# Patient Record
Sex: Female | Born: 1970 | Race: Black or African American | Hispanic: No | Marital: Single | Smoking: Former smoker
Health system: Southern US, Community
[De-identification: ages and names within clinical notes are randomized; demographics above are authoritative.]

## PROBLEM LIST (undated history)

## (undated) DIAGNOSIS — J449 Chronic obstructive pulmonary disease, unspecified: Secondary | ICD-10-CM

## (undated) DIAGNOSIS — G4733 Obstructive sleep apnea (adult) (pediatric): Secondary | ICD-10-CM

## (undated) DIAGNOSIS — L732 Hidradenitis suppurativa: Secondary | ICD-10-CM

## (undated) DIAGNOSIS — M1990 Unspecified osteoarthritis, unspecified site: Secondary | ICD-10-CM

## (undated) DIAGNOSIS — I10 Essential (primary) hypertension: Secondary | ICD-10-CM

## (undated) HISTORY — DX: Essential (primary) hypertension: I10

## (undated) HISTORY — DX: Morbid (severe) obesity due to excess calories: E66.01

## (undated) HISTORY — DX: Obstructive sleep apnea (adult) (pediatric): G47.33

## (undated) HISTORY — DX: Hidradenitis suppurativa: L73.2

## (undated) HISTORY — DX: Chronic obstructive pulmonary disease, unspecified: J44.9

## (undated) HISTORY — DX: Unspecified osteoarthritis, unspecified site: M19.90

---

## 2005-04-12 ENCOUNTER — Emergency Department (HOSPITAL_COMMUNITY): Admission: EM | Admit: 2005-04-12 | Discharge: 2005-04-13 | Payer: Self-pay | Admitting: Emergency Medicine

## 2005-04-12 ENCOUNTER — Emergency Department (HOSPITAL_COMMUNITY): Admission: EM | Admit: 2005-04-12 | Discharge: 2005-04-12 | Payer: Self-pay | Admitting: Emergency Medicine

## 2007-10-04 ENCOUNTER — Emergency Department (HOSPITAL_COMMUNITY): Admission: EM | Admit: 2007-10-04 | Discharge: 2007-10-04 | Payer: Self-pay | Admitting: Emergency Medicine

## 2008-09-10 ENCOUNTER — Emergency Department (HOSPITAL_COMMUNITY): Admission: EM | Admit: 2008-09-10 | Discharge: 2008-09-10 | Payer: Self-pay | Admitting: Emergency Medicine

## 2010-03-03 ENCOUNTER — Emergency Department (HOSPITAL_COMMUNITY): Admission: EM | Admit: 2010-03-03 | Discharge: 2010-03-03 | Payer: Self-pay | Admitting: Emergency Medicine

## 2010-03-10 ENCOUNTER — Emergency Department (HOSPITAL_COMMUNITY): Admission: EM | Admit: 2010-03-10 | Discharge: 2010-03-10 | Payer: Self-pay | Admitting: Emergency Medicine

## 2010-03-26 ENCOUNTER — Emergency Department (HOSPITAL_COMMUNITY): Admission: EM | Admit: 2010-03-26 | Discharge: 2010-03-26 | Payer: Self-pay | Admitting: Emergency Medicine

## 2010-04-01 ENCOUNTER — Emergency Department (HOSPITAL_COMMUNITY): Admission: EM | Admit: 2010-04-01 | Discharge: 2010-04-02 | Payer: Self-pay | Admitting: Emergency Medicine

## 2010-04-06 ENCOUNTER — Emergency Department (HOSPITAL_COMMUNITY): Admission: EM | Admit: 2010-04-06 | Discharge: 2010-04-07 | Payer: Self-pay | Admitting: Emergency Medicine

## 2010-04-15 ENCOUNTER — Emergency Department (HOSPITAL_COMMUNITY): Admission: EM | Admit: 2010-04-15 | Discharge: 2010-04-16 | Payer: Self-pay | Admitting: Emergency Medicine

## 2010-04-19 ENCOUNTER — Encounter: Payer: Self-pay | Admitting: Pulmonary Disease

## 2010-04-27 ENCOUNTER — Encounter (INDEPENDENT_AMBULATORY_CARE_PROVIDER_SITE_OTHER): Payer: Self-pay | Admitting: Cardiology

## 2010-04-27 ENCOUNTER — Ambulatory Visit (HOSPITAL_COMMUNITY): Admission: RE | Admit: 2010-04-27 | Discharge: 2010-04-27 | Payer: Self-pay | Admitting: Cardiology

## 2010-05-04 DIAGNOSIS — I10 Essential (primary) hypertension: Secondary | ICD-10-CM

## 2010-05-04 DIAGNOSIS — R05 Cough: Secondary | ICD-10-CM

## 2010-05-04 DIAGNOSIS — R0602 Shortness of breath: Secondary | ICD-10-CM

## 2010-05-04 DIAGNOSIS — R079 Chest pain, unspecified: Secondary | ICD-10-CM

## 2010-05-04 DIAGNOSIS — J45909 Unspecified asthma, uncomplicated: Secondary | ICD-10-CM | POA: Insufficient documentation

## 2010-05-05 ENCOUNTER — Ambulatory Visit: Payer: Self-pay | Admitting: Pulmonary Disease

## 2010-05-31 ENCOUNTER — Ambulatory Visit: Payer: Self-pay | Admitting: Pulmonary Disease

## 2010-06-06 ENCOUNTER — Telehealth (INDEPENDENT_AMBULATORY_CARE_PROVIDER_SITE_OTHER): Payer: Self-pay | Admitting: *Deleted

## 2010-06-30 ENCOUNTER — Telehealth: Payer: Self-pay | Admitting: Pulmonary Disease

## 2010-07-01 ENCOUNTER — Ambulatory Visit: Payer: Self-pay | Admitting: Pulmonary Disease

## 2010-07-01 DIAGNOSIS — G4730 Sleep apnea, unspecified: Secondary | ICD-10-CM | POA: Insufficient documentation

## 2010-08-03 ENCOUNTER — Ambulatory Visit: Payer: Self-pay | Admitting: Pulmonary Disease

## 2010-08-17 ENCOUNTER — Emergency Department (HOSPITAL_COMMUNITY): Admission: EM | Admit: 2010-08-17 | Discharge: 2010-08-17 | Payer: Self-pay | Admitting: Emergency Medicine

## 2010-08-29 ENCOUNTER — Emergency Department (HOSPITAL_COMMUNITY): Admission: EM | Admit: 2010-08-29 | Discharge: 2010-08-30 | Payer: Self-pay | Admitting: Emergency Medicine

## 2010-09-05 ENCOUNTER — Telehealth: Payer: Self-pay | Admitting: Pulmonary Disease

## 2010-10-12 ENCOUNTER — Ambulatory Visit: Payer: Self-pay | Admitting: Pulmonary Disease

## 2010-10-27 ENCOUNTER — Observation Stay (HOSPITAL_COMMUNITY): Admission: EM | Admit: 2010-10-27 | Discharge: 2010-10-29 | Payer: Self-pay | Admitting: Emergency Medicine

## 2010-10-28 ENCOUNTER — Encounter: Payer: Self-pay | Admitting: Internal Medicine

## 2010-11-02 ENCOUNTER — Ambulatory Visit: Payer: Self-pay | Admitting: Internal Medicine

## 2010-11-02 DIAGNOSIS — L732 Hidradenitis suppurativa: Secondary | ICD-10-CM

## 2010-11-11 ENCOUNTER — Ambulatory Visit: Payer: Self-pay | Admitting: Pulmonary Disease

## 2010-11-16 DIAGNOSIS — J4489 Other specified chronic obstructive pulmonary disease: Secondary | ICD-10-CM | POA: Insufficient documentation

## 2010-11-16 DIAGNOSIS — J449 Chronic obstructive pulmonary disease, unspecified: Secondary | ICD-10-CM

## 2010-12-13 IMAGING — CT CT ANGIO CHEST
2 of 5 series · 19 of 36 positions shown · IV contrast (APPLIED)
Comparison: C t a chest 04/02/2010.

CLINICAL DATA: Chest pain.  Numbness in the left side of the
chest.  Elevated D-dimer.

CT ANGIOGRAPHY CHEST WITH CONTRAST
TECHNIQUE: Multidetector CT imaging of the chest was performed
using the standard protocol during bolus administration of
intravenous contrast.  Multiplanar CT image reconstructions
including MIPs were obtained to evaluate the vascular anatomy.
Contrast:  100 ml Pmnipaque-EKK.

[Series 8: pulm embolism 1.5 b25f thins · axial · 0.72mm/px · z∈[-215,+6]mm · 16 of 307 slices shown]
[im 15/307  lung]
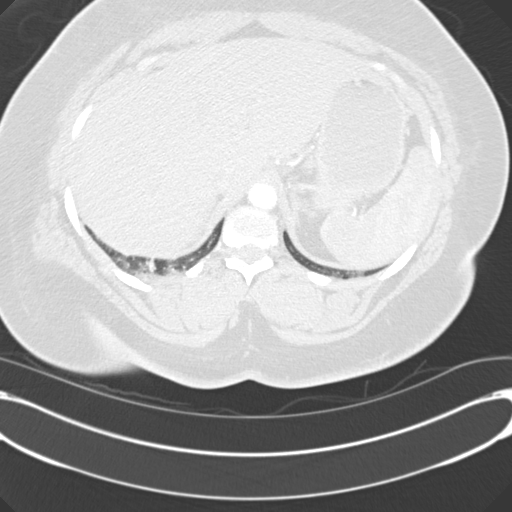
[im 30/307  mediastinal]
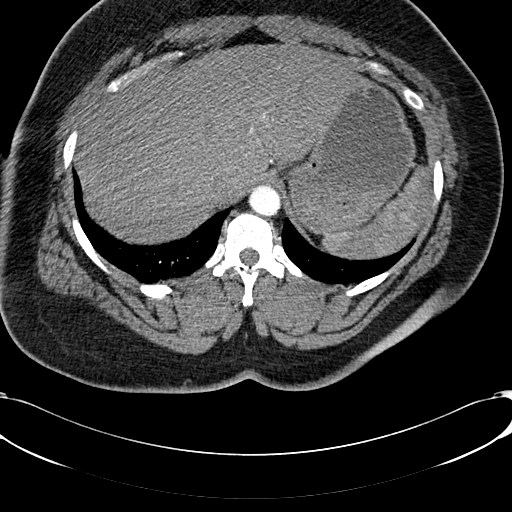
[im 59/307  lung]
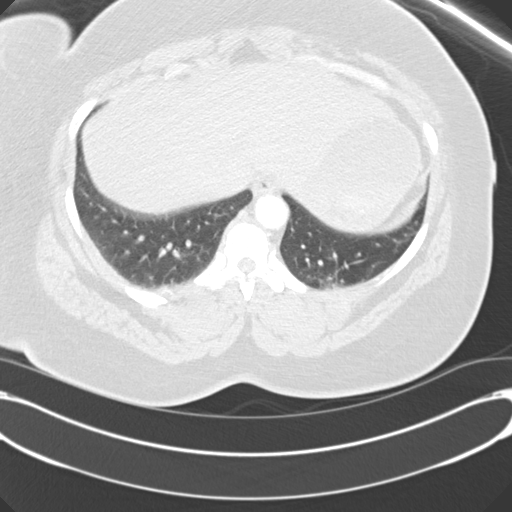
[im 73/307  mediastinal]
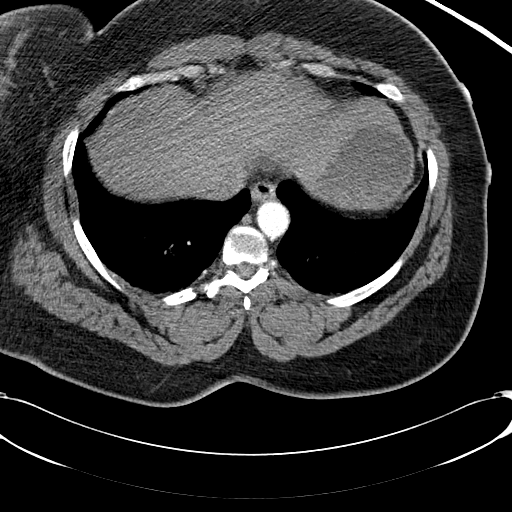
[im 88/307  lung]
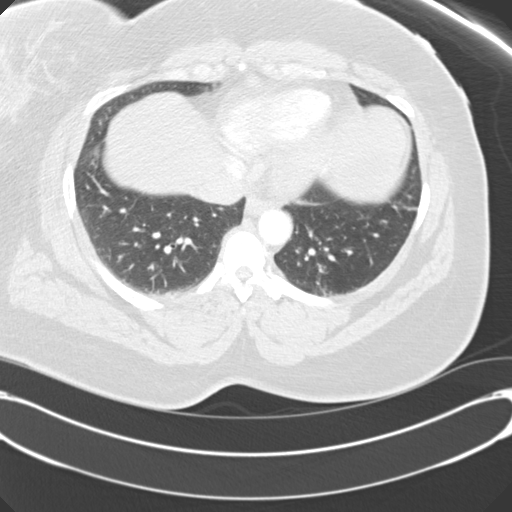
[im 103/307  mediastinal]
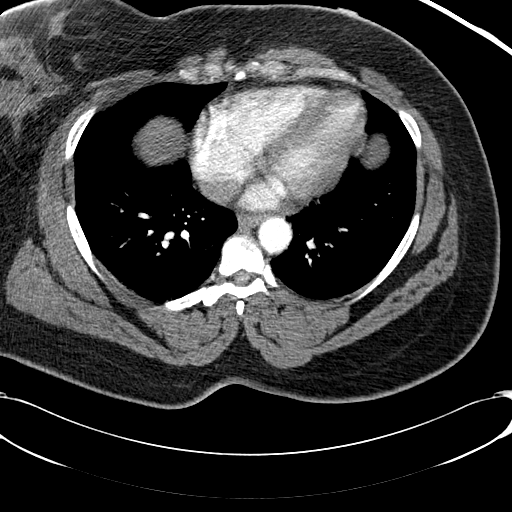
[im 132/307  lung]
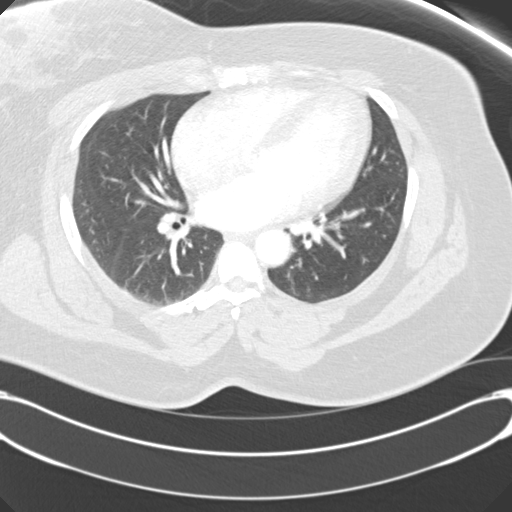
[im 146/307  mediastinal]
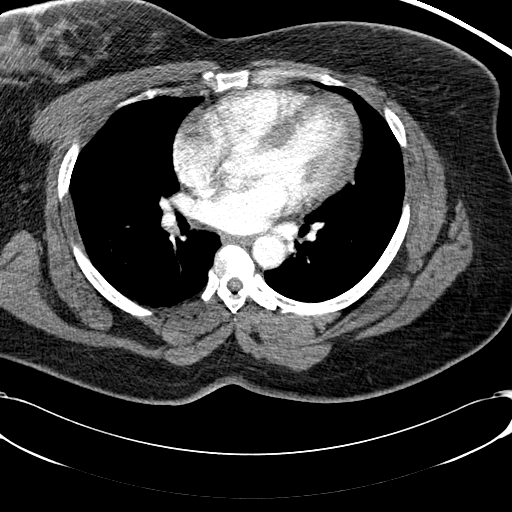
[im 161/307  lung]
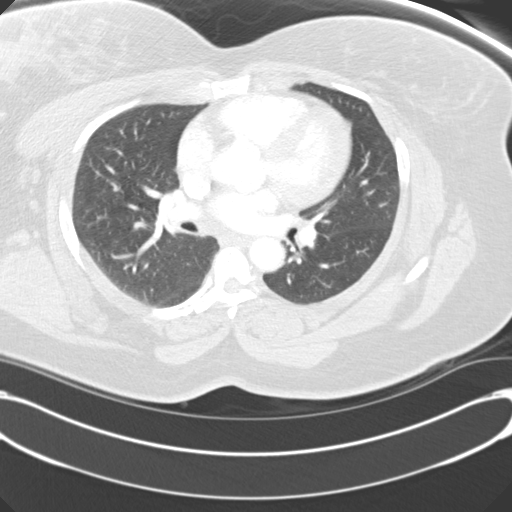
[im 175/307  mediastinal]
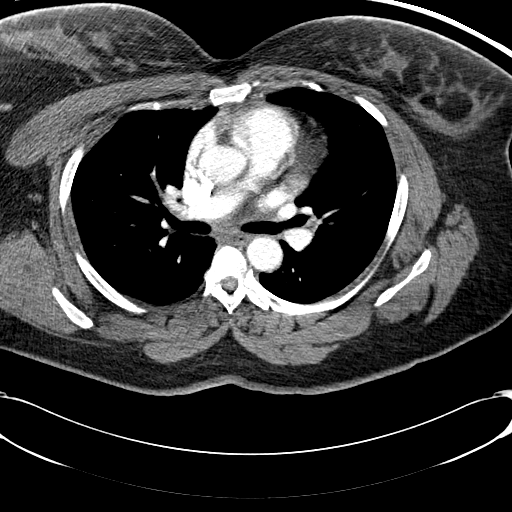
[im 205/307  lung]
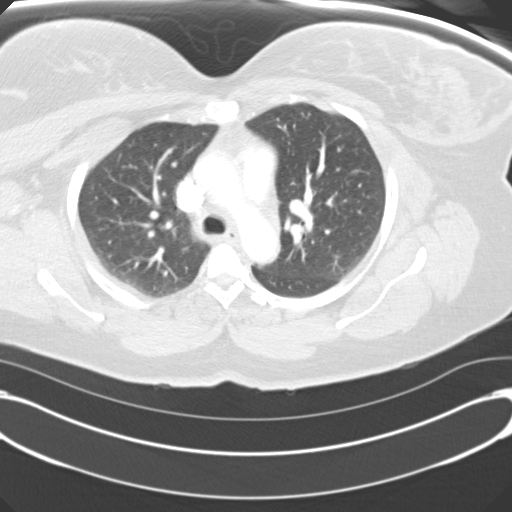
[im 219/307  mediastinal]
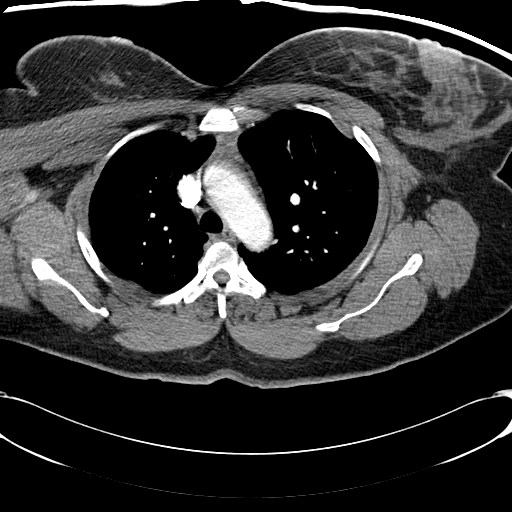
[im 234/307  lung]
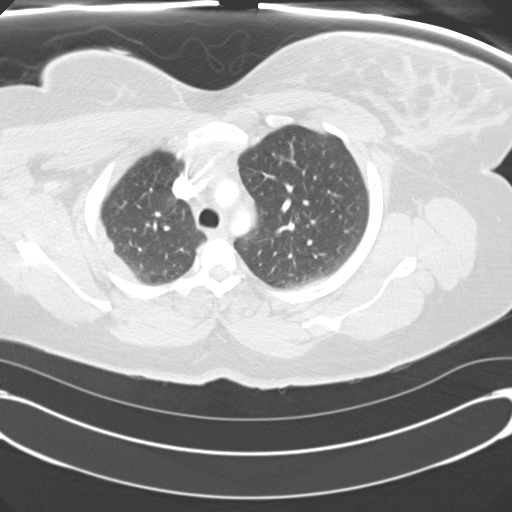
[im 248/307  mediastinal]
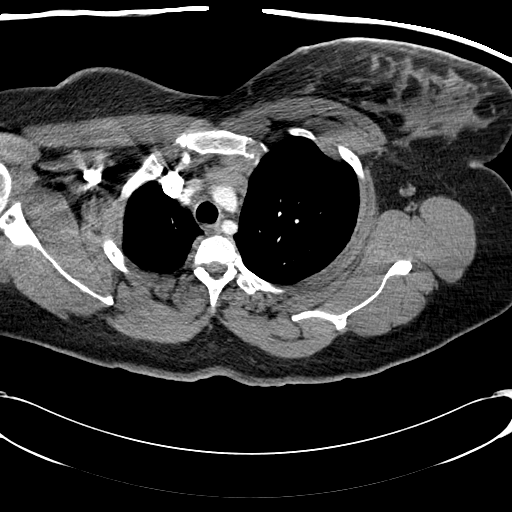
[im 277/307  lung]
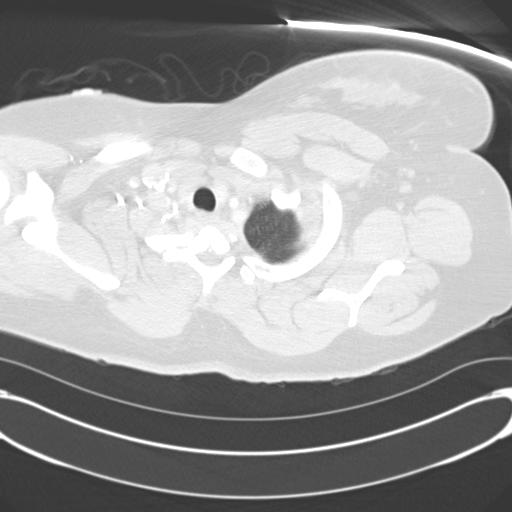
[im 292/307  mediastinal]
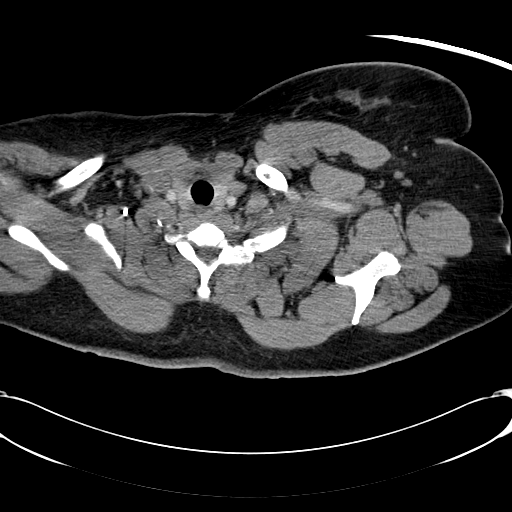

[Series 602: cor · coronal · 0.72mm/px · 3 of 93 slices shown]
[im 19/93  mediastinal]
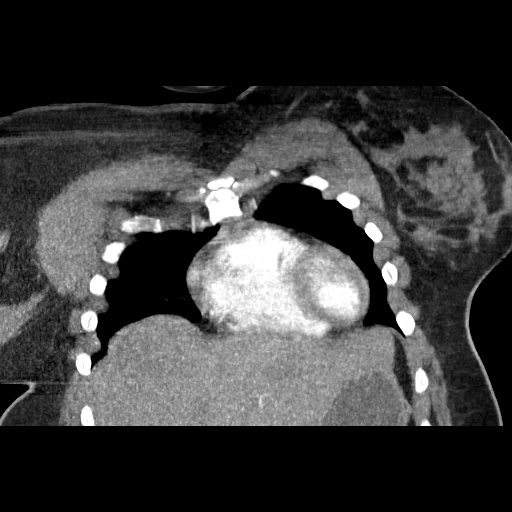
[im 37/93  mediastinal]
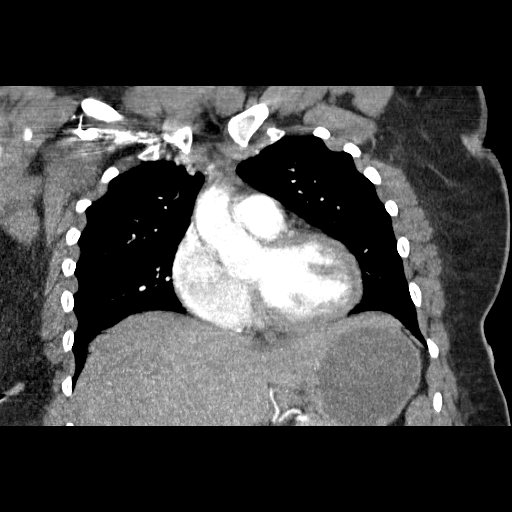
[im 56/93  mediastinal]
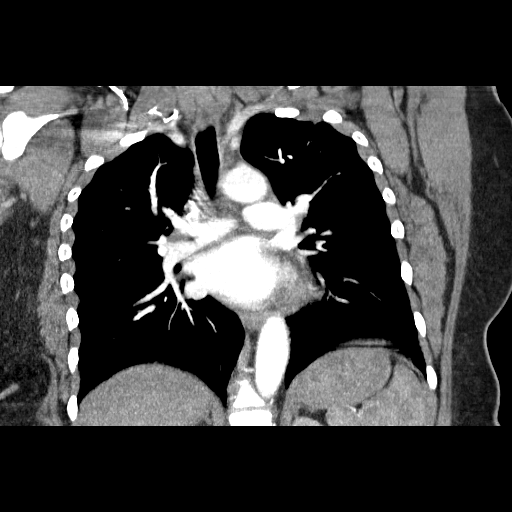

[19 of 36 positions shown; findings below may reference images not displayed]

FINDINGS: No pulmonary embolus is identified.  There is no pleural
or pericardial effusion.  Heart size is upper normal.  No axillary,
hilar or mediastinal lymphadenopathy.  The lungs demonstrate only
some mild dependent atelectatic change.  Visualized abdominal
contents are unremarkable.  There is no focal bony abnormality.

Review of the MIP images confirms the above findings.
IMPRESSION: Negative for pulmonary embolus.  Negative examination.

## 2010-12-14 ENCOUNTER — Ambulatory Visit: Admit: 2010-12-14 | Payer: Self-pay | Admitting: Pulmonary Disease

## 2010-12-15 ENCOUNTER — Telehealth: Payer: Self-pay | Admitting: Pulmonary Disease

## 2011-01-10 NOTE — Letter (Signed)
Summary: Viann Fish MD  Viann Fish MD   Imported By: Sherian Rein 05/11/2010 10:17:44  _____________________________________________________________________  External Attachment:    Type:   Image     Comment:   External Document

## 2011-01-10 NOTE — Discharge Summary (Signed)
Summary: Hospital Discharge Update    Hospital Discharge Update:  Date of Admission: 10/27/2010 Date of Discharge: 10/29/2010  Brief Summary:  Patient has chronic SOB and asthma flares in the past 2 years and reports a history of intubation in 1990's.  She was seen by her pulmonologist and was given a prednisone tapering dose on 11/2 and antibiotic; however, she did not fill her abx.  She was admitted for asthma exacerbation, worsening of SOB, productive cough with green sputum.   Patient was treated with IV solumedrol , Symbicort, Ventolin, Atrovent, Rocephin, and Azithromycin.  She improved significantly.   Labs needed at follow-up: CBC with differential, Basic metabolic panel  Other follow-up issues:  Check on her SOB and make sure she is satting ok.  The medication, problem, and allergy lists have been updated.  Please see the dictated discharge summary for details.  Discharge medications:  SYMBICORT 160-4.5 MCG/ACT AERO (BUDESONIDE-FORMOTEROL FUMARATE) Inhale 2 puffs two times a day PROVENTIL HFA 108 (90 BASE) MCG/ACT AERS (ALBUTEROL SULFATE) as needed ALBUTEROL SULFATE (2.5 MG/3ML) 0.083% NEBU (ALBUTEROL SULFATE) Every six hours and as needed  Other patient instructions:  You have an appointment with Dr. Cathey Endow on 11/02/2010 at 11:30 AM (Internal medicine Clinic) at [facility]  Please take your medications as prescribed above If your SOB gets progressively worse, please call our clinic or go to the ED   Note: Hospital Discharge Medications & Other Instructions handout was printed, one copy for patient and a second copy to be placed in hospital chart.

## 2011-01-10 NOTE — Assessment & Plan Note (Signed)
Summary: cough/Joan Brown   Visit Type:  Initial Consult Primary Provider/Referring Provider:  n/a  CC:  Pt c/o SOB x 2 years increasing over the last year.  History of Present Illness: 40/F , obese for evaluation of cough & 'asthma'. She has been evaluated by Dr Donnie Aho who feels she may have inflamed bronchial tubes. She does not have a PMD & has frequent ER visits. Repeated CXrs were nml as were CT angiograms on 04/02/10 & 04/15/10. She reports 'asthma' & bronchitis since childhood but worse in the last year. She developed dyspnea & cough in march & was initially treated with doxy, prednisone, she had ER visits onmarch 31, april 16, 23 , 28 & may 7th. She reports non productive cough without diurnal or seasonal variation. She has  used albuterol & pulmicort nebs from her cousin's daughter with some relief. She has used her cousin's spiriva samples with some relief. Dexilant was prescribed by dr Donnie Aho. he feels she does not have ischemic heart disease or pericarditis. She reports chest tightness, back spasms, pain int he back of her head & tingling in her extremities.   Preventive Screening-Counseling & Management  Alcohol-Tobacco     Smoking Status: quit     Packs/Day: 1.0     Year Started: 1988     Year Quit: 2011  Current Medications (verified): 1)  Proventil Hfa 108 (90 Base) Mcg/act Aers (Albuterol Sulfate) .... As Needed 2)  Albuterol Sulfate (2.5 Mg/24ml) 0.083% Nebu (Albuterol Sulfate) .... Every Six Hours and As Needed  Allergies (verified): No Known Drug Allergies  Past History:  Past Medical History: Last updated: 05/04/2010 ASTHMA (ICD-493.90) HYPERTENSION, BENIGN ESSENTIAL (ICD-401.1) OBESITY, MORBID (ICD-278.01) CHEST PAIN (ICD-786.50) COUGH (ICD-786.2) DYSPNEA (ICD-786.05)    Family History: Family History Asthma Allergies  Social History: Marital Status: single Children: no Occupation:Hair Stylist  Patient states former smoker. (1 ppd x 20 yrs, quit  04/02/2010) Smoking Status:  quit Packs/Day:  1.0  Review of Systems       The patient complains of shortness of breath with activity, shortness of breath at rest, productive cough, coughing up blood, chest pain, acid heartburn, weight change, abdominal pain, difficulty swallowing, tooth/dental problems, headaches, ear ache, anxiety, and hand/feet swelling.  The patient denies non-productive cough, irregular heartbeats, indigestion, loss of appetite, sore throat, nasal congestion/difficulty breathing through nose, sneezing, itching, depression, joint stiffness or pain, rash, change in color of mucus, and fever.    Vital Signs:  Patient profile:   40 year old female Height:      66 inches Weight:      272 pounds BMI:     44.06 O2 Sat:      98 % on Room air Temp:     98.2 degrees F oral Pulse rate:   74 / minute BP sitting:   140 / 92  (left arm) Cuff size:   large  Vitals Entered By: Zackery Barefoot CMA (May 05, 2010 9:18 AM)  O2 Flow:  Room air CC: Pt c/o SOB x 2 years increasing over the last year Comments Medications reviewed with patient Verified contact number and pharmacy with patient Zackery Barefoot CMA  May 05, 2010 9:19 AM    Physical Exam  Additional Exam:  Gen. Pleasant, well-nourished, in no distress, normal affect ENT - no lesions, no post nasal drip Neck: No JVD, no thyromegaly, no carotid bruits Lungs: no use of accessory muscles, no dullness to percussion, clear without rales or rhonchi  Cardiovascular: Rhythm regular, heart sounds  normal, no murmurs or gallops, no peripheral edema Abdomen: soft and non-tender, no hepatosplenomegaly, BS normal. Musculoskeletal: No deformities, no cyanosis or clubbing Neuro:  alert, non focal     Impression & Recommendations:  Problem # 1:  ASTHMA (ICD-493.90) Trial of symbicort 160/4.5 2 puffs two times a day  Use albuterol nebs as needed only Stop using pulmicort & spiriva Spirometry - pre/post on return visit to see  if she indeed has asthma Hope to decrease the frequency of her ER visits if we can get her a medical doctor.  Problem # 2:  COUGH (ICD-786.2)  Likely a combination of upper airway cough, gerd & less likley cough variant asthma. Would prefer to use generic meds here. Use pepcid two times a day (generic) instead of dexilant. Use zyrtec -D once daily x 30 days (generic)   Orders: Consultation Level IV (16109) Prescription Created Electronically [id number])  Medications Added to Medication List This Visit: 1)  Albuterol Sulfate (2.5 Mg/46ml) 0.083% Nebu (Albuterol sulfate) .... Every six hours and as needed  Other Orders: Pulmonary Referral (Pulmonary)  Patient Instructions: 1)  Copy sent to: [facility] , dr Donnie Aho 2)  Please schedule a follow-up appointment in 1 month with TP. 3)  Trial of symbicort 160/4.5 2 puffs two times a day  4)  Use albuterol nebs as needed only 5)  Stop using pulmicort & spiriva 6)  Use pepcid two times a day (generic) instead of dexilant 7)  Use zyrtec -D once daily x 30 days (generic)  8)  Breathing test when you return

## 2011-01-10 NOTE — Progress Notes (Signed)
Summary: nos appt  Phone Note Call from Patient   Caller: [email or web address]  Call For: alva Summary of Call: Rsc nos from 9/23 to 10/27 @ 3:30p. Initial call taken by: Darletta Moll,  September 05, 2010 2:03 PM

## 2011-01-10 NOTE — Progress Notes (Signed)
Summary: nos appt  Phone Note Call from Patient   Caller: [email or web address]  Call For: Elery Cadenhead Summary of Call: Rsc nos from 7/20 to 7/22 @ 4:15p. Initial call taken by: Darletta Moll,  June 30, 2010 9:52 AM

## 2011-01-10 NOTE — Progress Notes (Signed)
  Phone Note Other Incoming   Request: Send information Summary of Call: Request for records received from [facility]. Request forwarded to [facility].     

## 2011-01-10 NOTE — Assessment & Plan Note (Signed)
Summary: apena link only appt/cb   Allergies: No Known Drug Allergies  Past Pulmonary History:  Pulmonary History: ASTHMA (ICD-493.90) --PFT May 31, 2010 11>>>FEV1 2.20 l/m (76%), 13% change s/p BD   Other Orders: Sleep Std Airflow/Heartrate and O2 SAT unattended (16109)

## 2011-01-10 NOTE — Assessment & Plan Note (Signed)
Summary: NP follow up - PFT results   Primary Provider/Referring Provider:  n/a  CC:  follow up to review PFT results - states has wheezing and increased SOB at rest onset this morning, but this improved with albuterol HFA during PFT, and Back Pain.  History of Present Illness: 40/F , obese for evaluation of cough & 'asthma'.. Initial pulmonary consult 05/05/10. Continues to smoke.   05/05/10--She has been evaluated by Dr Donnie Aho who feels she may have inflamed bronchial tubes. She does not have a PMD & has frequent ER visits. Repeated CXrs were nml as were CT angiograms on 04/02/10 & 04/15/10. She reports 'asthma' & bronchitis since childhood but worse in the last year. She developed dyspnea & cough in march & was initially treated with doxy, prednisone, she had ER visits onmarch 31, april 16, 23 , 28 & may 7th. She reports non productive cough without diurnal or seasonal variation. She has  used albuterol & pulmicort nebs from her cousin's daughter with some relief. She has used her cousin's spiriva samples with some relief. Dexilant was prescribed by dr Donnie Aho. he feels she does not have ischemic heart disease or pericarditis. She reports chest tightness, back spasms, pain int he back of her head & tingling in her extremities.   May 31, 2010 --Returns for follow up and PFTs review. Last visit started on Symbicort 160/4.74mcg . Spiriva and pulmicort stopped. She depends on samples and does not have insurance -she is waiting on her medicaid right now. Her PFT showed today FEV1 2.20 l/m (76%)  , s/p bronchodilator w 13% change. I reviewed her PFT s results.  She cont to wear out easiy, no energy. Denies chest pain,  orthopnea, hemoptysis, fever, n/v/d, edema, headache.    Medications Prior to Update: 1)  Proventil Hfa 108 (90 Base) Mcg/act Aers (Albuterol Sulfate) .... As Needed 2)  Albuterol Sulfate (2.5 Mg/4ml) 0.083% Nebu (Albuterol Sulfate) .... Every Six Hours and As Needed  Current Medications  (verified): 1)  Proventil Hfa 108 (90 Base) Mcg/act Aers (Albuterol Sulfate) .... As Needed 2)  Albuterol Sulfate (2.5 Mg/15ml) 0.083% Nebu (Albuterol Sulfate) .... Every Six Hours and As Needed 3)  Symbicort 160-4.5 Mcg/act Aero (Budesonide-Formoterol Fumarate) .... Inhale 2 Puffs Two Times A Day  Allergies (verified): No Known Drug Allergies  Past History:  Family History: Last updated: 05/05/2010 Family History Asthma Allergies  Social History: Last updated: 05/31/2010 Marital Status: single Children: no Occupation:Hair Stylist  Patient states former smoker 04/05/10, smoked for 20 yr 1 PPD   Risk Factors: Smoking Status: quit (05/05/2010) Packs/Day: 1.0 (05/05/2010)  Past Medical History: ASTHMA (ICD-493.90) --PFT May 31, 2010 11>>>FEV1 2.20 l/m (76%), 13% change s/p BD  HYPERTENSION, BENIGN ESSENTIAL (ICD-401.1) OBESITY, MORBID (ICD-278.01) CHEST PAIN (ICD-786.50) COUGH (ICD-786.2) DYSPNEA (ICD-786.05)    Past Pulmonary History:  Pulmonary History: ASTHMA (ICD-493.90) --PFT May 31, 2010 11>>>FEV1 2.20 l/m (76%), 13% change s/p BD  Social History: Marital Status: single Children: no Occupation:Hair Stylist  Patient states former smoker 04/05/10, smoked for 20 yr 1 PPD   Review of Systems      See HPI  Vital Signs:  Patient profile:   40 year old female Height:      66 inches Weight:      270 pounds BMI:     43.74 O2 Sat:      94 % on Room air Temp:     97.6 degrees F oral Pulse rate:   71 / minute BP sitting:  138 / 80  (left arm) Cuff size:   regular  Vitals Entered By: Boone Master CNA/MA (May 31, 2010 11:11 AM)  O2 Flow:  Room air CC: follow up to review PFT results - states has wheezing and increased SOB at rest onset this morning, but this improved with albuterol HFA during PFT, Back Pain Is Patient Diabetic? No Comments Medications reviewed with patient Daytime contact number verified with patient. Boone Master CNA/MA  May 31, 2010  11:11 AM    Physical Exam  Additional Exam:  Gen. Pleasant, well-nourished, in no distress, normal affect, obese  ENT - no lesions, no post nasal drip Neck: No JVD, no thyromegaly, no carotid bruits Lungs: no use of accessory muscles, no dullness to percussion, clear without rales or rhonchi  Cardiovascular: Rhythm regular, heart sounds  normal, no murmurs or gallops, no peripheral edema Abdomen: soft and non-tender, no hepatosplenomegaly, BS normal. Musculoskeletal: No deformities, no cyanosis or clubbing Neuro:  alert, non focal     Impression & Recommendations:  Problem # 1:  ASTHMA (ICD-493.90) PFTs w/ 13% change s/p BD will cont on Symbicort  will need to decide on referral  to [facility] vs rx assistance program if she does not have medicaid on return to help w/ rx  sample for 4 weeks given.   Medications Added to Medication List This Visit: 1)  Symbicort 160-4.5 Mcg/act Aero (Budesonide-formoterol fumarate) .... Inhale 2 puffs two times a day  Complete Medication List: 1)  Symbicort 160-4.5 Mcg/act Aero (Budesonide-formoterol fumarate) .... Inhale 2 puffs two times a day 2)  Proventil Hfa 108 (90 Base) Mcg/act Aers (Albuterol sulfate) .... As needed 3)  Albuterol Sulfate (2.5 Mg/13ml) 0.083% Nebu (Albuterol sulfate) .... Every six hours and as needed  Other Orders: Est. Patient Level III (28413)  Patient Instructions: 1)  Continue on symbicort 160/4.108mcg 2 puffs two times a day , brush, rinse and gargle after use.  2)  follow up 4 weeks Dr. Vassie Loll  3)       Appended Document: NP follow up - PFT results minimal obstruction esp in smaller airways - doubt this is cacounting for her dyspnea

## 2011-01-10 NOTE — Miscellaneous (Signed)
Summary: Orders Update pft charges  Clinical Lists Changes  Orders: Added new Service order of Carbon Monoxide diffusing w/capacity (94720) - Signed Added new Service order of Lung Volumes (94240) - Signed Added new Service order of Spirometry (Pre & Post) (94060) - Signed 

## 2011-01-10 NOTE — Assessment & Plan Note (Addendum)
Summary: rov/jd   Visit Type:  Follow-up Primary Provider/Referring Provider:  n/a  CC:  6 month follow up Asthma/OSA. c/o increased SOB with exertion and at rest, wheezing, and productive cough with thick yellow mucus. Increased use of Albuterol neb and inhaler w/o relief. Pt request flu vaccine.  History of Present Illness: 40/F , ex -smoker, obese for evaluation of cough & 'asthma'. She depends on samples and does not have insurance -she is waiting on her medicaid right now.  She does not have a PMD & has frequent ER visits.  05/05/10--She has been evaluated by Dr Donnie Aho who feels she may have inflamed bronchial tubes.  Repeated CXrs were nml as were CT angiograms on 04/02/10 & 04/15/10. She reports 'asthma' & bronchitis since childhood but worse in the last year.  She reports non productive cough without diurnal or seasonal variation. She has  used albuterol & pulmicort nebs from her cousin's daughter with some relief. She has used her cousin's spiriva samples with some relief. Dexilant was prescribed by dr Donnie Aho. he feels she does not have ischemic heart disease or pericarditis.  -started on Symbicort 160/4.43mcg . Spiriva and pulmicort stopped.   July 01, 2010 4:30 PM  Cough & dyspnea better. has been unable to enroll with [facility]. Sharing meds with fiance. Her PFT showed  FEV1 2.20 l/m (76%)  , s/p bronchodilator w 13% change. I reviewed her PFT s results. Reports excessive daytime somnolence, loud snoring, choking episodes that wake her up. Home study showed  mild obstructive sleep apnea - stopped/ slowed breathing 8/h , weight loss advised   October 12, 2010 4:30 PM  ER visit for dyspnea -given pred quit smoking, could not get symbicort filled, got orange card -hope to start with [facility] & start getting prescriptions filled. Using nebs 4-5 times daily,  Bouts of coughing in the office  Denies chest pain,  orthopnea, hemoptysis, fever, n/v/d, edema, headache.   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  The patient denies anorexia, fever, weight loss, weight gain, vision loss, decreased hearing, hoarseness, chest pain, syncope, peripheral edema, prolonged cough, headaches, hemoptysis, abdominal pain, melena, hematochezia, severe indigestion/heartburn, hematuria, muscle weakness, suspicious skin lesions, transient blindness, difficulty walking, depression, unusual weight change, abnormal bleeding, enlarged lymph nodes, and angioedema.    Vital Signs:  Patient profile:   40 year old female Height:      66 inches Weight:      271.6 pounds BMI:     44.00 O2 Sat:      97 % on Room air Temp:     98.9 degrees F oral Pulse rate:   94 / minute BP sitting:   128 / 92  (  left arm) Cuff size:   large  Vitals Entered By: Zackery Barefoot CMA (October 12, 2010 4:19 PM)  O2 Flow:  Room air CC: 6 month follow up Asthma/OSA. c/o increased SOB with exertion and at rest, wheezing, productive cough with thick yellow mucus. Increased use of Albuterol neb and inhaler w/o relief. Pt request flu vaccine Comments Medications reviewed with patient Verified contact number  and pharmacy with patient Zackery Barefoot CMA  October 12, 2010 4:20 PM    Physical Exam  Additional Exam:  wt 271 October 12, 2010  Gen. Pleasant, well-nourished, in no distress, normal affect, obese  ENT - no lesions, no post nasal drip, class 3 airway Neck: No JVD, no thyromegaly, no carotid bruits Lungs: no use of accessory muscles, no dullness to percussion, decreased BL without rales or rhonchi  Cardiovascular: Rhythm regular, heart sounds  normal, no murmurs or gallops, no peripheral edema Musculoskeletal: No deformities, no cyanosis or clubbing      Impression & Recommendations:  Problem # 1:  ASTHMA (ICD-493.90) Symbicort 160/4.5 2 puffs two times a day - sample Get Rx fille dfrom [facility] DO NOT use nebuliser more than 4 times/ day Discussed plan for exacerbation - given steroid taper Rx - hopefully this will avoid ER visit We will try to see her more frequently to avoid ER visits  Problem # 2:  COUGH (ICD-786.2)  some element of VCD here Doxy x 10 ds for acute bronchitis  Orders: Est. Patient Level IV (16109) Prescription Created Electronically [id number])  Problem # 3:  OBSTRUCTIVE SLEEP APNEA (ICD-780.57)  mild on home study, explore further once asthma better controlled or if wt gain  Orders: Est. Patient Level IV (09811) Prescription Created Electronically [id number])  Medications Added to Medication List This Visit: 1)  Albuterol Sulfate (2.5 Mg/84ml) 0.083% Nebu (Albuterol sulfate) .... Every six hours and as needed 2)  Doxycycline Hyclate 100 Mg Caps (Doxycycline hyclate) .... Once daily 3)  Prednisone 10 Mg Tabs (Prednisone) .... Take 4 tabs  daily with food x 4 days, then 3 tabs daily x 4 days, then 2 tabs daily x 4 days, then 1 tab daily x4 days then stop. #40  Patient Instructions: 1)  Copy sent to: [facility] 2)  Please schedule a follow-up appointment in 1 month with TP 3)  Antibiotic x 10 ds 4)  Symbicort 160/4.5 2 puffs two times a day -  sample 5)  Get Rx fille dfrom [facility] 6)  DO NOT use nebuliser more than 4 times/ day Prescriptions: PREDNISONE 10 MG TABS (PREDNISONE) Take 4 tabs  daily with food x 4 days, then 3 tabs daily x 4 days, then 2 tabs daily x 4 days, then 1 tab daily x4 days then stop. #40  #40 x 0   Entered and Authorized by:   Comer Locket Vassie Loll MD   Signed by:   Comer Locket Vassie Loll MD on 10/12/2010   Method used:   Electronically to        [facility] (retail)       [address]       [place], [place]  [address]       Ph: [phone]       Fax: [phone]   RxID:   [id number] DOXYCYCLINE HYCLATE 100 MG CAPS (DOXYCYCLINE HYCLATE) once daily  #10 x 0   Entered and Authorized by:   Comer Locket. Vassie Loll MD   Signed  by:   Comer Locket Vassie Loll MD on 10/12/2010   Method used:   Electronically to        [facility] (retail)       [address]       [place], [place]  [address]       Ph: [phone]       Fax: [phone]   RxID:   [id number] ALBUTEROL SULFATE (2.5 MG/3ML) 0.083% NEBU (ALBUTEROL SULFATE) Every six hours and as needed  #60 x 2   Entered and Authorized by:   Comer Locket. Vassie Loll MD   Signed by:   Comer Locket Vassie Loll MD on 10/12/2010   Method used:   Electronically to        [facility] (retail)       [address]       [place], [place]  [address]       Ph: [phone]       Fax: [phone]   RxID:   [id number]

## 2011-01-10 NOTE — Assessment & Plan Note (Signed)
Summary: rov/jd   Visit Type:  Follow-up Primary Provider/Referring Provider:  n/a  CC:  Asthma.  The patient says her cough is slightly better but still productive with yellowish mucus. She says her breathing is no better or worse.Marland Kitchen  History of Present Illness: 39/F , smoker, obese for evaluation of cough & 'asthma'.. Initial pulmonary consult 05/05/10. Continues to smoke.   05/05/10--She has been evaluated by Dr Donnie Aho who feels she may have inflamed bronchial tubes. She does not have a PMD & has frequent ER visits. Repeated CXrs were nml as were CT angiograms on 04/02/10 & 04/15/10. She reports 'asthma' & bronchitis since childhood but worse in the last year. She developed dyspnea & cough in march & was initially treated with doxy, prednisone, she had ER visits on march 31, april 16, 23 , 28 & may 7th. She reports non productive cough without diurnal or seasonal variation. She has  used albuterol & pulmicort nebs from her cousin's daughter with some relief. She has used her cousin's spiriva samples with some relief. Dexilant was prescribed by dr Donnie Aho. he feels she does not have ischemic heart disease or pericarditis. She reports chest tightness, back spasms, pain int he back of her head & tingling in her extremities.   May 31, 2010 --Returns for follow up and PFTs review. Last visit started on Symbicort 160/4.32mcg . Spiriva and pulmicort stopped. She depends on samples and does not have insurance -she is waiting on her medicaid right now.   July 01, 2010 4:30 PM  Cough & dyspnea better. has been unable to enroll with [facility]. Sharing meds with fiance. Her PFT showed  FEV1 2.20 l/m (76%)  , s/p bronchodilator w 13% change. I reviewed her PFT s results. Reports excessive daytime somnolence, loud snoring, choking episodes that wake her up.  Denies chest pain,  orthopnea, hemoptysis, fever, n/v/d, edema, headache.   Current Medications (verified): 1)  Symbicort 160-4.5 Mcg/act Aero  (Budesonide-Formoterol Fumarate) .... Inhale 2 Puffs Two Times A Day 2)  Proventil Hfa 108 (90 Base) Mcg/act Aers (Albuterol Sulfate) .... As Needed 3)  Albuterol Sulfate (2.5 Mg/55ml) 0.083% Nebu (Albuterol Sulfate) .... Every Six Hours and As Needed  Allergies (verified): No Known Drug Allergies  Past History:  Past Medical History: Last updated: 05/31/2010 ASTHMA (ICD-493.90) --PFT May 31, 2010 11>>>FEV1 2.20 l/m (76%), 13% change s/p BD  HYPERTENSION, BENIGN ESSENTIAL (ICD-401.1) OBESITY, MORBID (ICD-278.01) CHEST PAIN (ICD-786.50) COUGH (ICD-786.2) DYSPNEA (ICD-786.05)    Social History: Last updated: 05/31/2010 Marital Status: single Children: no Occupation:Hair Stylist  Patient states former smoker 04/05/10, smoked for 20 yr 1 PPD   Past Pulmonary History:  Pulmonary History: ASTHMA (ICD-493.90) --PFT May 31, 2010 11>>>FEV1 2.20 l/m (76%), 13% change s/p BD  Review of Systems       The patient complains of dyspnea on exertion.  The patient denies anorexia, fever, weight loss, weight gain, vision loss, decreased hearing, hoarseness, chest pain, syncope, peripheral edema, prolonged cough, headaches, hemoptysis, abdominal pain, melena, hematochezia, severe indigestion/heartburn, hematuria, muscle weakness, suspicious skin lesions, difficulty walking, depression, unusual weight change, and abnormal bleeding.    Vital Signs:  Patient profile:   40 year old female Height:      66 inches (167.64 cm) Weight:      269.50 pounds (122.50 kg) BMI:     43.66 O2 Sat:      93 % on Room air Temp:     98.3 degrees F (36.83 degrees C) oral Pulse rate:  82 / minute BP sitting:   132 / 80  (right arm) Cuff size:   large  Vitals Entered By: Michel Bickers CMA (July 01, 2010 4:14 PM)  O2 Sat at Rest %:  93 O2 Flow:  Room air CC: Asthma.  The patient says her cough is slightly better but still productive with yellowish mucus. She says her breathing is no better or worse. Comments  Medications reviewed. Daytime phone verified. Michel Bickers [facility]  July 01, 2010 4:15 PM   Physical Exam  Additional Exam:  Gen. Pleasant, well-nourished, in no distress, normal affect, obese  ENT - no lesions, no post nasal drip, class 3 airway Neck: No JVD, no thyromegaly, no carotid bruits Lungs: no use of accessory muscles, no dullness to percussion, clear without rales or rhonchi  Cardiovascular: Rhythm regular, heart sounds  normal, no murmurs or gallops, no peripheral edema Musculoskeletal: No deformities, no cyanosis or clubbing      Impression & Recommendations:  Problem # 1:  ASTHMA (ICD-493.90) PFTs w/ 13% change s/p BD will cont on Symbicort   referral  to [facility] vs rx assistance program if she does not have medicaid on return to help w/ rx   Problem # 2:  OBSTRUCTIVE SLEEP APNEA (ICD-780.57) The pathophysiology of obstructive sleep apnea, it's cardiovascular consequences and modes of treatment including CPAP were discussed with the patient in great detail.  Home sleep study weight loss  Other Orders: Est. Patient Level III (16109) Sleep Disorder Referral (Sleep Disorder)  Patient Instructions: 1)  Copy sent to: 2)  Please schedule a follow-up appointment in 6 weeks. 3)  Stay on symbicort 2 puffs two times a day - RINSE mouth after use 4)  Try to get medications from [facility] 5)  Home sleep study - you mayneed  a CPAP machine 6)  Weight loss  Appended Document: rov/jd let her know - she has mild obstructive sleep apnea - stopped/ slowed breathing 8/h , weight loss advised at this time, call for excessive daytime somnolence or worsening  Appended Document: rov/jd pt informed. jwr  Appended Document: rov/jd pl arrange FU OV with TP - recent ER visit  Appended Document: rov/jd pt set to see RA on 09-02-10 at 11:30.

## 2011-01-10 NOTE — Assessment & Plan Note (Signed)
Summary: Acute NP office visit - bronchitis   Primary Provider/Referring Provider:  n/a  CC:  post hospital and coughing still. Marland Kitchen  History of Present Illness: 39/F , ex -smoker, obese for evaluation of cough & 'asthma'. She depends on samples and does not have insurance -she is waiting on her medicaid right now.  She does not have a PMD & has frequent ER visits.  05/05/10--She has been evaluated by Dr Donnie Aho who feels she may have inflamed bronchial tubes.  Repeated CXrs were nml as were CT angiograms on 04/02/10 & 04/15/10. She reports 'asthma' & bronchitis since childhood but worse in the last year.  She reports non productive cough without diurnal or seasonal variation. She has  used albuterol & pulmicort nebs from her cousin's daughter with some relief. She has used her cousin's spiriva samples with some relief. Dexilant was prescribed by dr Donnie Aho. he feels she does not have ischemic heart disease or pericarditis.  -started on Symbicort 160/4.20mcg . Spiriva and pulmicort stopped.   July 01, 2010 4:30 PM  Cough & dyspnea better. has been unable to enroll with [facility]. Sharing meds with fiance. Her PFT showed  FEV1 2.20 l/m (76%)  , s/p bronchodilator w 13% change. I reviewed her PFT s results. Reports excessive daytime somnolence, loud snoring, choking episodes that wake her up. Home study showed  mild obstructive sleep apnea - stopped/ slowed breathing 8/h , weight loss advised   October 12, 2010-- ER visit for dyspnea -given pred quit smoking, could not get symbicort filled, got orange card -hope to start with [facility] & start getting prescriptions filled. Using nebs 4-5 times daily,  Bouts of coughing in the office  Denies chest pain,  orthopnea, hemoptysis, fever, n/v/d, edema, headache.  11/11/10--Presents for a post hospital visit. Admitted 11/17-19/11 for asthma/COPD exacerbation in active smoker. Tx w/  IV abx, steroids, nebs.  Was discharged on symbicort. She has been set up  with OP clinics to help with meds cost. Med compliance in past due to cost have  been an issue. She is feeling some better but still has a cough which can be bad at times. Denies chest pain,   orthopnea, hemoptysis, fever, n/v/d, edema, headache.  Medications Prior to Update: 1)  Symbicort 160-4.5 Mcg/act Aero (Budesonide-Formoterol Fumarate) .... Inhale 2 Puffs Two Times A Day 2)  Proventil Hfa 108 (90 Base) Mcg/act Aers (Albuterol Sulfate) .... As Needed 3)  Pepcid Ac 10 Mg Tabs (Famotidine) 4)  Tetracycline Hcl 500 Mg Caps (Tetracycline Hcl) .... Take 1 Tablet By Mouth Two Times A Day For 1 Month. 5)  Hydrochlorothiazide 25 Mg Tabs (Hydrochlorothiazide) .... Take 1/2 Tablets By Mouth Once Daily.  Current Medications (verified): 1)  Symbicort 160-4.5 Mcg/act Aero (Budesonide-Formoterol Fumarate) .... Inhale 2 Puffs Two Times A Day 2)  Proventil Hfa 108 (90 Base) Mcg/act Aers (Albuterol Sulfate) .... Inhale 2 Puffs Every Four Hours As Needed 3)  Pepcid Ac 10 Mg Tabs (Famotidine) .... Take 1 Tablet By Mouth Two Times A Day W/ Meals 4)  Tetracycline Hcl 500 Mg Caps (Tetracycline Hcl) .... Take 1 Tablet By Mouth Two Times A Day For 1 Month. 5)  Hydrochlorothiazide 25 Mg Tabs (Hydrochlorothiazide) .... Take 1/2 Tablets By Mouth Once Daily. 6)  Albuterol Sulfate (2.5 Mg/59ml) 0.083% Nebu (Albuterol Sulfate) .Marland Kitchen.. 1 Vial Via Hhn Every 6 Hours As Needed  Allergies (verified): No Known Drug Allergies  Past History:  Past Medical History: Last updated: 05/31/2010 ASTHMA (ICD-493.90) --PFT May 31, 2010 11>>>FEV1 2.20 l/m (76%), 13% change s/p BD  HYPERTENSION, BENIGN ESSENTIAL (ICD-401.1) OBESITY, MORBID (ICD-278.01) CHEST PAIN (ICD-786.50) COUGH (ICD-786.2) DYSPNEA (ICD-786.05)    Family History: Last updated: 11/02/2010 Family History Asthma  Social History: Last updated: 05/31/2010 Marital Status: single Children: no Occupation:Hair Stylist  Patient states former smoker 04/05/10,  smoked for 20 yr 1 PPD   Risk Factors: Smoking Status: quit (10/12/2010) Packs/Day: 1.0 (10/12/2010)  Past Pulmonary History:  Pulmonary History: ASTHMA (ICD-493.90) --PFT May 31, 2010 11>>>FEV1 2.20 l/m (76%), 13% change s/p BD  Review of Systems      See HPI  Vital Signs:  Patient profile:   40 year old female Height:      66 inches Weight:      271.50 pounds BMI:     43.98 O2 Sat:      97 % on Room air Temp:     99.4 degrees F oral Pulse rate:   84 / minute BP sitting:   142 / 84  (left arm) Cuff size:   large  Vitals Entered By: Boone Master CNA/MA (November 11, 2010 3:02 PM)  O2 Flow:  Room air CC: post hospital, coughing still.  Is Patient Diabetic? No Comments Medications reviewed with patient Daytime contact number verified with patient. Boone Master CNA/MA  November 11, 2010 3:06 PM    Physical Exam  Additional Exam:  wt 271 October 12, 2010  Gen.  obese female  ENT - no lesions, no post nasal drip, class 3 airway Neck: No JVD, no thyromegaly, no carotid bruits Lungs: no use of accessory muscles, no dullness to percussion, decreased BL without rales or rhonchi  Cardiovascular: Rhythm regular, heart sounds  normal, no murmurs or gallops, no peripheral edema Musculoskeletal: No deformities, no cyanosis or clubbing      Impression & Recommendations:  Problem # 1:  ASTHMA (ICD-493.90)  Recent asthma  flare in active smoker w/ cyclical cough Plan:  Mucinex DM two times a day as needed cough/congestion Claritin 10mg  at bedtime as needed drainage.  Begin Dexilant 60mg  once daily before meal,-take until samples are gone then begin Omeprzole 20mg  once daily before meal .  GERD diet  Work on not coughing or clearing your throat , use sugarless candy, ice chips, water--NO MINTS  Please contact office for sooner follow up if symptoms do not improve or worsen  follow up Dr. Vassie Loll in 4 weeks   Medications Added to Medication List This Visit: 1)   Proventil Hfa 108 (90 Base) Mcg/act Aers (Albuterol sulfate) .... Inhale 2 puffs every four hours as needed 2)  Pepcid Ac 10 Mg Tabs (Famotidine) .... Take 1 tablet by mouth two times a day w/ meals 3)  Albuterol Sulfate (2.5 Mg/51ml) 0.083% Nebu (Albuterol sulfate) .Marland Kitchen.. 1 vial via hhn every 6 hours as needed 4)  Omeprazole 20 Mg Cpdr (Omeprazole) .Marland Kitchen.. 1 by mouth once daily  Complete Medication List: 1)  Symbicort 160-4.5 Mcg/act Aero (Budesonide-formoterol fumarate) .... Inhale 2 puffs two times a day 2)  Proventil Hfa 108 (90 Base) Mcg/act Aers (Albuterol sulfate) .... Inhale 2 puffs every four hours as needed 3)  Pepcid Ac 10 Mg Tabs (Famotidine) .... Take 1 tablet by mouth two times a day w/ meals 4)  Hydrochlorothiazide 25 Mg Tabs (Hydrochlorothiazide) .... Take 1/2 tablets by mouth once daily. 5)  Albuterol Sulfate (2.5 Mg/30ml) 0.083% Nebu (Albuterol sulfate) .Marland Kitchen.. 1 vial via hhn every 6 hours as needed 6)  Omeprazole 20 Mg  Cpdr (Omeprazole) .Marland Kitchen.. 1 by mouth once daily 7)  Doxycycline Hyclate 100 Mg Caps (Doxycycline hyclate) .... Take 1 tablet by mouth twice a day for 30 days  Other Orders: Est. Patient Level IV (38756)  Patient Instructions: 1)  Mucinex DM two times a day as needed cough/congestion 2)  Claritin 10mg  at bedtime as needed drainage.  3)  Begin Dexilant 60mg  once daily before meal,-take until samples are gone then begin Omeprzole 20mg  once daily before meal .  4)  GERD diet  5)  Work on not coughing or clearing your throat , use sugarless candy, ice chips, water--NO MINTS  6)  Please contact office for sooner follow up if symptoms do not improve or worsen  7)  follow up Dr. Vassie Loll in 4 weeks  Prescriptions: OMEPRAZOLE 20 MG CPDR (OMEPRAZOLE) 1 by mouth once daily  #30 x 5   Entered and Authorized by:   Rubye Oaks NP   Signed by:   Tammy Parrett NP on 11/11/2010   Method used:   Print then Give to Patient   RxID:   [id number]    Immunization  History:  Influenza Immunization History:    Influenza:  historical (10/28/2010)  Pneumovax Immunization History:    Pneumovax:  historical (10/28/2010)

## 2011-01-10 NOTE — Assessment & Plan Note (Signed)
Summary: NEW HFU-PER DR HO/CFB   Vital Signs:  Patient profile:   40 year old female Height:      66 inches (167.64 cm) Weight:      270.8 pounds (123.09 kg) BMI:     43.87 Temp:     98.6 degrees F (37.00 degrees C) oral Pulse rate:   80 / minute BP sitting:   155 / 95  (right arm) Cuff size:   large  Vitals Entered By: Theotis Barrio NT II (November 02, 2010 11:47 AM)  Primary Care Provider:  n/a   History of Present Illness: This is a 40 year old female with morbid obesity, asthma and OSA who presents for hospital follow up after being discharged on 10/29/10 for exacerbation of asthma/copd with overlying bronchitis. Pt has been on a steroid taper and has one more day remaining. Pt was also treated with 3 days of azythromycin in the hospital. Pt continues to complain of some hoarseness and mild cough though these are greatly improved by menthol lozenges.  Pt does state that her cough is improving and that her breathing is much better though she continues to have mild SOB with exertion.  Pt is currently using her albuterol nebulizer twice daily and is taking all her medications as prescribed.  Pt has about a 1 week supply of her inhalers left and is requesting a refill today though she plans to speek with Ms. Hill as she is not sure that she will be able to afford the refills.   The pt does have a hx of OSA and a sleep screen demonstrated about 8 awakenings per hour.  In Dr. Reginia Naas last note he mentioned continuing to improve the pts asthma symptoms and then reevaluating the OSA to determine if CPAP is needed.  The pt does continue to have a few episodes nightly in which she finds herself gasping for air but this is stable and unchanged.   Pt is also complaining of small pustules under her armpits and breasts that are becoming worse but have been occuring for years and have lead to some mild scaring.  Pt did notice during her hospitalization that her symptoms improved with the azithromycin.      Allergies: No Known Drug Allergies  Past History:  Past Medical History: Last updated: 05/31/2010 ASTHMA (ICD-493.90) --PFT May 31, 2010 11>>>FEV1 2.20 l/m (76%), 13% change s/p BD  HYPERTENSION, BENIGN ESSENTIAL (ICD-401.1) OBESITY, MORBID (ICD-278.01) CHEST PAIN (ICD-786.50) COUGH (ICD-786.2) DYSPNEA (ICD-786.05)    Family History: Reviewed history from 05/05/2010 and no changes required. Family History Asthma  Social History: Reviewed history from 05/31/2010 and no changes required. Marital Status: single Children: no Occupation:Hair Stylist  Patient states former smoker 04/05/10, smoked for 20 yr 1 PPD   Review of Systems       Pt denies any CP, fever, chills or change in her BM's.   Physical Exam  General:  alert and well-developed.    Repeat BP: 158/85 Head:  normocephalic and atraumatic.   Eyes:  vision grossly intact, pupils equal, pupils round, and pupils reactive to light.  Mouth:  pharynx pink and moist.   Neck:  supple. Breasts:  There are scattered small 1mm pustules on the inferior portions of the pts breasts bilaterally.  Lungs:  normal respiratory effort, normal breath sounds, no crackles, and no wheezes.   Heart:  normal rate, regular rhythm, no murmur, no gallop, and no rub. Abdomen:  soft, non-tender, normal bowel sounds, and no distention. Pulses:  2+ dp/pt pulses bilaterally.  Extremities:  No edema.  Skin:  There are small pustules with residual scaring under the axillae bilaterally.  Axillary Nodes:  no R axillary adenopathy and no L axillary adenopathy.     Impression & Recommendations:  Problem # 1:  ASTHMA (ICD-493.90) Pt appears to be improving but still has some mild cough and hoarseness which is likely secondary to her recent bronchitis.  Pt states that OTC menthol lozenges help this a great deal and I recommend that she continue these as directed on the package.  With regards to the patient's asthma, she appears to be improving  and her lungs were clear today with no wheezes.  Pt will go talk to [facility] after the appointment today to discuss funding for her medications.  Given her improved condition I did not make any changes in her medications today and refilled each of her inhalers.  Her updated medication list for this problem includes:    Symbicort 160-4.5 Mcg/act Aero (Budesonide-formoterol fumarate) ..... Inhale 2 puffs two times a day    Proventil Hfa 108 (90 Base) Mcg/act Aers (Albuterol sulfate) .Marland Kitchen... As needed    Albuterol Sulfate (2.5 Mg/71ml) 0.083% Nebu (Albuterol sulfate) ..... Every six hours and as needed  Her updated medication list for this problem includes:    Symbicort 160-4.5 Mcg/act Aero (Budesonide-formoterol fumarate) ..... Inhale 2 puffs two times a day    Proventil Hfa 108 (90 Base) Mcg/act Aers (Albuterol sulfate) .Marland Kitchen... As needed    Albuterol Sulfate (2.5 Mg/33ml) 0.083% Nebu (Albuterol sulfate) ..... Every six hours and as needed  Problem # 2:  HYPERTENSION, BENIGN ESSENTIAL (ICD-401.1) Given that the pt was hypertensive in the hospital and her BP was elevated twice during the visit today, I have started the pt on 12.5 mg HCTZ daily and will see her back in one month to follow up on her blood pressure and check a BMET.   Her updated medication list for this problem includes:    Hydrochlorothiazide 25 Mg Tabs (Hydrochlorothiazide) .Marland Kitchen... Take 1/2 tablets by mouth once daily.  Problem # 3:  HIDRADENITIS SUPPURATIVA (ICD-705.83) The pts lesions are consistent with hidradenitis suppurativa, though, it could simply be rash from heat/moisture in these locations.  I have instructed the patient on lifestyle modifications such as the use of gentile soaps, ensuring complete drying prior to dressing herself, and avoiding wash cloths.  I will aslo treat the patient with one month of tetracycline given that she did see improvement on antibiotic therapy in the hospital.  Problem # 4:  OBSTRUCTIVE SLEEP  APNEA (ICD-780.57)  Pts nocturnal awakenings are consistent with her sleep apnea.  I educated her on the importance of weight loss and pt agreed to keep her appointment next month with Dr. Vassie Loll for further evaluation and consideration of CPAP.  Problem # 5:  Preventive Health Care (ICD-V70.0) Flu shot.given in hospital.   Complete Medication List: 1)  Symbicort 160-4.5 Mcg/act Aero (Budesonide-formoterol fumarate) .... Inhale 2 puffs two times a day 2)  Proventil Hfa 108 (90 Base) Mcg/act Aers (Albuterol sulfate) .... As needed 3)  Albuterol Sulfate (2.5 Mg/45ml) 0.083% Nebu (Albuterol sulfate) .... Every six hours and as needed 4)  Pepcid Ac 10 Mg Tabs (Famotidine) 5)  Tetracycline Hcl 500 Mg Caps (Tetracycline hcl) .... Take 1 tablet by mouth two times a day for 1 month. 6)  Hydrochlorothiazide 25 Mg Tabs (Hydrochlorothiazide) .... Take 1/2 tablets by mouth once daily.  Patient Instructions: 1)  For  the bumps under your arms, allow yourself to become completely dry after showers, avoid using washcloths (use your hand instead), and avoid harsh soaps.   2)  Please schedule a follow-up appointment in 1 month. Prescriptions: HYDROCHLOROTHIAZIDE 25 MG TABS (HYDROCHLOROTHIAZIDE) Take 1/2 tablets by mouth once daily.  #30 x 3   Entered and Authorized by:   Sinda Du MD   Signed by:   Sinda Du MD on 11/02/2010   Method used:   Print then Give to Patient   RxID:   [id number] PEPCID AC 10 MG TABS (FAMOTIDINE)   #30 x 3   Entered and Authorized by:   Sinda Du MD   Signed by:   Sinda Du MD on 11/02/2010   Method used:   Print then Give to Patient   RxID:   [id number] ALBUTEROL SULFATE (2.5 MG/3ML) 0.083% NEBU (ALBUTEROL SULFATE) Every six hours and as needed  #60 x 2   Entered and Authorized by:   Sinda Du MD   Signed by:   Sinda Du MD on 11/02/2010   Method used:   Print then Give to Patient   RxID:   [id number] PROVENTIL HFA 108 (90 BASE)  MCG/ACT AERS (ALBUTEROL SULFATE) as needed  #1 x 3   Entered and Authorized by:   Sinda Du MD   Signed by:   Sinda Du MD on 11/02/2010   Method used:   Print then Give to Patient   RxID:   [id number] SYMBICORT 160-4.5 MCG/ACT AERO (BUDESONIDE-FORMOTEROL FUMARATE) Inhale 2 puffs two times a day  #1 x 3   Entered and Authorized by:   Sinda Du MD   Signed by:   Sinda Du MD on 11/02/2010   Method used:   Print then Give to Patient   RxID:   [id number] TETRACYCLINE HCL 500 MG CAPS (TETRACYCLINE HCL) Take 1 tablet by mouth two times a day for 1 month.  #60 x 0   Entered and Authorized by:   Sinda Du MD   Signed by:   Sinda Du MD on 11/02/2010   Method used:   Print then Give to Patient   RxID:   [id number]    Orders Added: 1)  Est. Patient Level III [62831]  Appended Document: NEW HFU-PER DR HO/CFB pt will be using [facility], they do not carry tetracycline, need something else ordered  Appended Document: NEW HFU-PER DR HO/CFB A precription for doxycycline 100 mg by mouth two times a day X 30 days was faxed to the [facility] to replace the tetracycline.  Please call Ms. Roll to inform her of this.  Thank You.

## 2011-01-12 ENCOUNTER — Ambulatory Visit (INDEPENDENT_AMBULATORY_CARE_PROVIDER_SITE_OTHER): Payer: Self-pay | Admitting: Pulmonary Disease

## 2011-01-12 ENCOUNTER — Ambulatory Visit: Admit: 2011-01-12 | Payer: Self-pay | Admitting: Pulmonary Disease

## 2011-01-12 ENCOUNTER — Encounter: Payer: Self-pay | Admitting: Pulmonary Disease

## 2011-01-12 DIAGNOSIS — J45909 Unspecified asthma, uncomplicated: Secondary | ICD-10-CM

## 2011-01-12 DIAGNOSIS — G4730 Sleep apnea, unspecified: Secondary | ICD-10-CM

## 2011-01-12 NOTE — Progress Notes (Signed)
Summary: nos appt  Phone Note Call from Patient   Caller: [email or web address]  Call For: alva Summary of Call: Rsc nos from 1/4 to 2/2. Initial call taken by: Darletta Moll,  December 15, 2010 8:51 AM

## 2011-01-17 ENCOUNTER — Telehealth (INDEPENDENT_AMBULATORY_CARE_PROVIDER_SITE_OTHER): Payer: Self-pay | Admitting: *Deleted

## 2011-01-18 NOTE — Assessment & Plan Note (Signed)
Summary: rov   Vital Signs:  Patient profile:   40 year old female Height:      66 inches Weight:      280.50 pounds BMI:     45.44 O2 Sat:      95 % on Room air Temp:     97.8 degrees F oral Pulse rate:   86 / minute BP sitting:   130 / 82  (right arm) Cuff size:   large  Vitals Entered By: Arman Filter LPN (January 12, 2011 2:11 PM)  O2 Flow:  Room air CC: States cough has improved- still coughs up clear to yellow colored sputum.  Also c/o wheezing, tightness in chest, and sore throat. Denies increased sob.  Denies smoking cigarettes. Comments Medications reviewed with patient. Aundra Millet Reynolds LPN  January 12, 2011 2:12 PM    Visit Type:  Follow-up Primary Provider/Referring Provider:  n/a  CC:  States cough has improved- still coughs up clear to yellow colored sputum.  Also c/o wheezing, tightness in chest, and and sore throat. Denies increased sob.  Denies smoking cigarettes..  History of Present Illness: 40/F , ex -smoker, obese for evaluation of cough & 'asthma'. She depends on samples and does not have insurance -she is waiting on her medicaid right now.   05/05/10--Repeated CXrs were nml as were CT angiograms on 04/02/10 & 04/15/10. She reports 'asthma' & bronchitis since childhood but worse in the last year.    -started on Symbicort 160/4.87mcg . Spiriva and pulmicort stopped.   July 01, 2010 4:30 PM  PFT showed  FEV1 2.20 l/m (76%)  , s/p bronchodilator w 13%  Home study showed  mild obstructive sleep apnea - stopped/ slowed breathing 8/h , weight loss advised   January 12, 2011 2:21 PM  Admitted 11/17-19/11 for asthma/COPD exacerbation in active smoker. Tx w/  IV abx, steroids, nebs.  Was discharged on symbicort. She has been set up with OP clinics to help with meds cost. Med compliance in past due to cost have  been an issue.  No flares in last 3 months, has intermittent chests tightness lt axilla & non productive cough, has tried to avoid neb. Enquiring about cppa  machine. she has custody of her nieces  Denies chest pain,   orthopnea, hemoptysis, fever, n/v/d, edema, headache.     Current Medications (verified): 1)  Symbicort 160-4.5 Mcg/act Aero (Budesonide-Formoterol Fumarate) .... Inhale 2 Puffs Two Times A Day 2)  Proventil Hfa 108 (90 Base) Mcg/act Aers (Albuterol Sulfate) .... Inhale 2 Puffs Every Four Hours As Needed 3)  Hydrochlorothiazide 25 Mg Tabs (Hydrochlorothiazide) .... Take 1/2 Tablets By Mouth Once Daily. 4)  Albuterol Sulfate (2.5 Mg/67ml) 0.083% Nebu (Albuterol Sulfate) .Marland Kitchen.. 1 Vial Via Hhn Every 6 Hours As Needed 5)  Omeprazole 20 Mg Cpdr (Omeprazole) .Marland Kitchen.. 1 By Mouth Once Daily 6)  Mucinex D 60-600 Mg Xr12h-Tab (Pseudoephedrine-Guaifenesin) .... Take 1 Tablet By Mouth Two Times A Day  Allergies (verified): No Known Drug Allergies  Past History:  Past Medical History: Last updated: 05/31/2010 ASTHMA (ICD-493.90) --PFT May 31, 2010 11>>>FEV1 2.20 l/m (76%), 13% change s/p BD  HYPERTENSION, BENIGN ESSENTIAL (ICD-401.1) OBESITY, MORBID (ICD-278.01) CHEST PAIN (ICD-786.50) COUGH (ICD-786.2) DYSPNEA (ICD-786.05)    Social History: Last updated: 05/31/2010 Marital Status: single Children: no Occupation:Hair Stylist  Patient states former smoker 04/05/10, smoked for 20 yr 1 PPD   Past Pulmonary History:  Pulmonary History: ASTHMA (ICD-493.90) --PFT May 31, 2010 11>>>FEV1 2.20 l/m (76%), 13%  change s/p BD  Review of Systems  The patient denies anorexia, fever, weight loss, weight gain, vision loss, decreased hearing, hoarseness, chest pain, syncope, dyspnea on exertion, peripheral edema, prolonged cough, headaches, hemoptysis, abdominal pain, melena, hematochezia, severe indigestion/heartburn, hematuria, muscle weakness, suspicious skin lesions, difficulty walking, depression, unusual weight change, abnormal bleeding, enlarged lymph nodes, and angioedema.    Physical Exam  Additional Exam:  wt 271 October 12, 2010  , 280 January 12, 2011  Gen.  obese female  ENT - no lesions, no post nasal drip, class 3 airway Neck: No JVD, no thyromegaly, no carotid bruits Lungs: no use of accessory muscles, no dullness to percussion, decreased BL without rales or rhonchi  Cardiovascular: Rhythm regular, heart sounds  normal, no murmurs or gallops, no peripheral edema Musculoskeletal: No deformities, no cyanosis or clubbing      Impression & Recommendations:  Problem # 1:  ASTHMA (ICD-493.90) Not convinced she has 'true 'as thma but wil treat as such for now. Stay on symbicort for maintenance & ventolin for rescue , try to avoid steroid pulses here  Problem # 2:  OBSTRUCTIVE SLEEP APNEA (ICD-780.57)  Mild with [facility] 8/h Best to focus on weight loss here. If she has not lost wt or  remains symptomatic in 3 months, may have to pursue empiric  trial of cpap  Orders: Est. Patient Level III (47425)  Medications Added to Medication List This Visit: 1)  Mucinex D 60-600 Mg Xr12h-tab (Pseudoephedrine-guaifenesin) .... Take 1 tablet by mouth two times a day  Patient Instructions: 1)  Please schedule a follow-up appointment in 2 months with TP 2)  Stay on symbicort 3)  Use ventolin as needed  4)  You may need a cpap machine in the future, but slee p study only showe dmild obstructive sleep apnea - so weight loss is advised for now   Orders Added: 1)  Est. Patient Level III [95638]

## 2011-01-19 ENCOUNTER — Ambulatory Visit (INDEPENDENT_AMBULATORY_CARE_PROVIDER_SITE_OTHER): Payer: Self-pay | Admitting: Ophthalmology

## 2011-01-19 ENCOUNTER — Encounter: Payer: Self-pay | Admitting: Ophthalmology

## 2011-01-19 VITALS — BP 122/84 | HR 81 | Temp 97.9°F | Ht 66.0 in | Wt 278.1 lb

## 2011-01-19 DIAGNOSIS — Z0000 Encounter for general adult medical examination without abnormal findings: Secondary | ICD-10-CM

## 2011-01-19 DIAGNOSIS — M545 Low back pain, unspecified: Secondary | ICD-10-CM | POA: Insufficient documentation

## 2011-01-19 DIAGNOSIS — Z23 Encounter for immunization: Secondary | ICD-10-CM

## 2011-01-19 DIAGNOSIS — M25569 Pain in unspecified knee: Secondary | ICD-10-CM

## 2011-01-19 DIAGNOSIS — I10 Essential (primary) hypertension: Secondary | ICD-10-CM

## 2011-01-19 DIAGNOSIS — R600 Localized edema: Secondary | ICD-10-CM

## 2011-01-19 DIAGNOSIS — J45909 Unspecified asthma, uncomplicated: Secondary | ICD-10-CM

## 2011-01-19 DIAGNOSIS — Z1231 Encounter for screening mammogram for malignant neoplasm of breast: Secondary | ICD-10-CM

## 2011-01-19 DIAGNOSIS — H538 Other visual disturbances: Secondary | ICD-10-CM

## 2011-01-19 DIAGNOSIS — M1712 Unilateral primary osteoarthritis, left knee: Secondary | ICD-10-CM | POA: Insufficient documentation

## 2011-01-19 DIAGNOSIS — R609 Edema, unspecified: Secondary | ICD-10-CM

## 2011-01-19 MED ORDER — ALBUTEROL SULFATE HFA 108 (90 BASE) MCG/ACT IN AERS: 2.0000 | INHALATION_SPRAY | RESPIRATORY_TRACT | Status: DC | PRN

## 2011-01-19 MED ORDER — HYDROCHLOROTHIAZIDE 25 MG PO TABS: 12.5000 mg | ORAL_TABLET | Freq: Every day | ORAL | Status: DC

## 2011-01-19 MED ORDER — ACETAMINOPHEN 325 MG PO TABS: 650.0000 mg | ORAL_TABLET | ORAL | Status: DC | PRN

## 2011-01-19 MED ORDER — OMEPRAZOLE 20 MG PO CPDR
20.0000 mg | DELAYED_RELEASE_CAPSULE | Freq: Every day | ORAL | Status: DC
Start: 2011-01-19 — End: 2011-09-28

## 2011-01-19 MED ORDER — BUDESONIDE-FORMOTEROL FUMARATE 160-4.5 MCG/ACT IN AERO: 2.0000 | INHALATION_SPRAY | Freq: Two times a day (BID) | RESPIRATORY_TRACT | Status: DC

## 2011-01-19 NOTE — Patient Instructions (Signed)
Please return in approximately 2 months for routine Pap smear. If you need [place] in the meantime please call for an earlier appointment. Continue to try to lose weight through diet and exercise

## 2011-01-19 NOTE — Assessment & Plan Note (Addendum)
The patient's blurry vision is likely secondary to refractive error, due to her age. Currently, the patient denies any flashes or darkening of her vision. The patient's floaters likely represent PVD. I will however, have the patient followup with an ophthalmologist for  A full ophthalmic exam..

## 2011-01-19 NOTE — Assessment & Plan Note (Signed)
The patients blood pressure was within reasonable control today (BP: 122/84 mmHg ) and I will not make any adjustments to the patients anti-hypertensive regimen. I will continue to monitor and titrate the patients medications as needed at future visits.

## 2011-01-19 NOTE — Assessment & Plan Note (Addendum)
This is currently stable,  And the patient is being treated with  symbicort and albuterol.  I will continue this at this time, the patient is scheduled for repeat followup visit lobe our pulmonology on April 2.  The patient is currently taking all her medications regularly and requests a refill today.

## 2011-01-19 NOTE — Assessment & Plan Note (Signed)
The patient is very proactive on it comes to her health, and was interested in preventative health care at this time. The patient had 3 small children with her today, so a Pap smear was deferred. I will see the patient back in 2 months to perform this exam. I will administer a tetanus vaccination today as well as have the patient scheduled for a screening mammogram.

## 2011-01-19 NOTE — Progress Notes (Signed)
Subjective:    Patient ID: Joan Brown, female    DOB: Jun 02, 1971, 40 y.o.   MRN: [id number]  HPI   This is a 40 year old female with a past medical history significant for COPD hypertension and morbid obesity who presents because of blurred vision , feet pain, and pain in her knees. The patient was last seen by Dr. Vassie Loll on January 12, 2011 4  For productive cough the patient was just treated with Symbicort and Ventolin for rescue.  Since the patient was seen, her cough is resolved and she's not had any significant shortness of breath. The patient is however concerned about blurry vision has been going on, off and on, for approximately 2 weeks. The patient does not use any corrective lenses , denies any visual problems in the past. The patient does see occasional floaters, this is been going on for months.   The patient is also complaining about knee pain, she states that she injured her left knee when she was 14 and has had pain in the knee since that time currently than knee pain is bilateral it is a 6/10 in intensity. The pain is worsened by exertion. The patient has not had  significant swelling or erythema of the knees. The patient also concerned about pain in her feet she states is associated with lower extremity edema. The patient did have a cardiac echo done in May of 2011 which demonstrated normal systolic function with an EF of 60%. The patient has used compression stockings in the past and this has led to resolution of her symptoms. Lastly, the patient is concerned about back pain, that has been chronic for years, in the lumbar paraspinal muscles. The pain has gotten worse since April of last year she's never taken any medications for it.  The patient denies any saddle anesthesia, or loss of control of her bowel or bladder.  Review of Systems  Constitutional: Negative for fever and chills.  Respiratory: Negative for cough and shortness of breath.   Cardiovascular: Negative for chest pain and  palpitations.  Gastrointestinal: Negative for vomiting, diarrhea and constipation.       Objective:   Physical Exam  Constitutional: She appears well-developed and well-nourished.  HENT:  Head: Normocephalic and atraumatic.  Eyes: Pupils are equal, round, and reactive to light.  Cardiovascular: Normal rate, regular rhythm and intact distal pulses.  Exam reveals no gallop and no friction rub.   No murmur heard. Pulmonary/Chest: Effort normal and breath sounds normal. She has no wheezes. She has no rales.  Abdominal: Soft. Bowel sounds are normal. She exhibits no distension. There is no tenderness.  Musculoskeletal: Normal range of motion.       There is paraspinal muscle tenderness in the lumbar spine without decreased range of motion or bony point tenderness.   Neurological: She is alert. No cranial nerve deficit.       Reflexes 2+ bilaterally, normal strength, sensation intact.   Skin: No rash noted.       Current Outpatient Prescriptions on File Prior to Visit  Medication Sig Dispense Refill  . albuterol (PROVENTIL HFA) 108 (90 BASE) MCG/ACT inhaler Inhale 2 puffs into the lungs every 4 (four) hours as needed.        Marland Kitchen albuterol (PROVENTIL) (2.5 MG/3ML) 0.083% nebulizer solution Take 2.5 mg by nebulization every 6 (six) hours as needed.        . budesonide-formoterol (SYMBICORT) 160-4.5 MCG/ACT inhaler Inhale 2 puffs into the lungs 2 (two) times daily.        Marland Kitchen  famotidine (PEPCID AC) 10 MG tablet Take 10 mg by mouth 2 (two) times daily with meals.        . hydrochlorothiazide 25 MG tablet Take 12.5 mg by mouth daily.        Marland Kitchen omeprazole (PRILOSEC) 20 MG capsule Take 20 mg by mouth daily.          Past Medical History  Diagnosis Date  . COPD (chronic obstructive pulmonary disease)   . Hidradenitis suppurativa   . Asthma   . Benign essential hypertension   . Morbid obesity   . Chest pain   . Obstructive sleep apnea      Assessment & Plan:

## 2011-01-19 NOTE — Assessment & Plan Note (Addendum)
This is chronic, and there are currently no red flag signs. I would recommend continued conservative management at this time.  I told the patient that at this point, she could take Tylenol for her pain. The pain appears to be located over the paraspinal muscles of the lumbar spine. There is no point tenderness over the spinous proceses, and the patients neurologic exam is normal.

## 2011-01-19 NOTE — Assessment & Plan Note (Signed)
The etiology of this is unclear at this time, however it may represent venous stasis changes. Patient only has mild edema on physical exam, and had a normal echocardiogram performed back in May of last year. The patient's renal function was also normal in November of last year. The patient saw significant improvement in her symptoms with compression stockings , and I recommended that she continue those at this point in time. If the patient does not see improvement in her symptoms, with conservative management , I will continue to work up the etiology.

## 2011-01-19 NOTE — Assessment & Plan Note (Signed)
This is likely secondary to  Osteoarthritis, given history of past trauma in the setting of a morbidly obese patient. I recommended that the patient start taking Tylenol as needed for her pain. I did strictly instruct the patient not to take more than 2500 g daily. I also informed the patient that the best way to improve her knee pain is through continued attempts at weight loss. I recommended diet as well as increase exercise and the patient seemed motivated to try this.

## 2011-01-19 NOTE — Assessment & Plan Note (Addendum)
I had a long discussion regarding the health effects of obesity with the patient today, including risks of DM and CAD.  The patient understands these risks and plans to try to diet and exercise more.

## 2011-01-26 NOTE — Progress Notes (Signed)
  Phone Note Other Incoming   Request: Send information Summary of Call: Request for records received from [facility]. Request forwarded to [facility].  06/01/2010

## 2011-01-31 ENCOUNTER — Ambulatory Visit (HOSPITAL_COMMUNITY): Payer: Self-pay | Attending: Internal Medicine

## 2011-02-09 ENCOUNTER — Emergency Department (HOSPITAL_COMMUNITY): Payer: Self-pay

## 2011-02-09 ENCOUNTER — Emergency Department (HOSPITAL_COMMUNITY)
Admission: EM | Admit: 2011-02-09 | Discharge: 2011-02-09 | Disposition: A | Discharge status: Home / Self Care | Payer: Self-pay | Attending: Emergency Medicine | Admitting: Emergency Medicine

## 2011-02-13 ENCOUNTER — Encounter: Payer: Self-pay | Admitting: Ophthalmology

## 2011-02-15 ENCOUNTER — Ambulatory Visit (HOSPITAL_COMMUNITY)
Admission: RE | Admit: 2011-02-15 | Discharge: 2011-02-15 | Disposition: A | Discharge status: Home / Self Care | Payer: Self-pay | Source: Ambulatory Visit | Attending: Internal Medicine | Admitting: Internal Medicine

## 2011-02-15 DIAGNOSIS — Z1231 Encounter for screening mammogram for malignant neoplasm of breast: Secondary | ICD-10-CM | POA: Insufficient documentation

## 2011-02-17 ENCOUNTER — Other Ambulatory Visit: Payer: Self-pay | Admitting: Internal Medicine

## 2011-02-17 ENCOUNTER — Emergency Department (HOSPITAL_COMMUNITY): Payer: Self-pay

## 2011-02-17 ENCOUNTER — Emergency Department (HOSPITAL_COMMUNITY)
Admission: EM | Admit: 2011-02-17 | Discharge: 2011-02-17 | Disposition: A | Discharge status: Home / Self Care | Payer: Self-pay | Attending: Emergency Medicine | Admitting: Emergency Medicine

## 2011-02-17 DIAGNOSIS — R928 Other abnormal and inconclusive findings on diagnostic imaging of breast: Secondary | ICD-10-CM

## 2011-02-17 DIAGNOSIS — J45901 Unspecified asthma with (acute) exacerbation: Secondary | ICD-10-CM | POA: Insufficient documentation

## 2011-02-21 LAB — INFLUENZA PANEL BY PCR (TYPE A & B)
H1N1 flu by pcr: NOT DETECTED
Influenza A By PCR: NEGATIVE
Influenza B By PCR: NEGATIVE

## 2011-02-21 LAB — COMPREHENSIVE METABOLIC PANEL
ALT: 21 U/L (ref 0–35)
Albumin: 3.2 g/dL — ABNORMAL LOW (ref 3.5–5.2)
Alkaline Phosphatase: 68 U/L (ref 39–117)
Calcium: 9.1 mg/dL (ref 8.4–10.5)
GFR calc Af Amer: >60 mL/min (ref >60)
Glucose, Bld: 135 mg/dL — ABNORMAL HIGH (ref 70–99)
Potassium: 3.7 mEq/L (ref 3.5–5.1)
Sodium: 138 mEq/L (ref 135–145)
Total Protein: 7 g/dL (ref 6.0–8.3)

## 2011-02-21 LAB — POCT I-STAT, CHEM 8
Calcium, Ion: 1.07 mmol/L — ABNORMAL LOW (ref 1.12–1.32)
Chloride: 104 mEq/L (ref 96–112)
Glucose, Bld: 108 mg/dL — ABNORMAL HIGH (ref 70–99)
HCT: 41 % (ref 36.0–46.0)
Hemoglobin: 13.9 g/dL (ref 12.0–15.0)

## 2011-02-21 LAB — DIFFERENTIAL
Basophils Relative: 0 % (ref 0–1)
Eosinophils Absolute: 0.4 10*3/uL (ref 0.0–0.7)
Lymphs Abs: 3.7 10*3/uL (ref 0.7–4.0)
Monocytes Absolute: 0.6 10*3/uL (ref 0.1–1.0)
Monocytes Relative: 7 % (ref 3–12)

## 2011-02-21 LAB — URINALYSIS, ROUTINE W REFLEX MICROSCOPIC
Bilirubin Urine: NEGATIVE
Glucose, UA: NEGATIVE mg/dL
Hgb urine dipstick: NEGATIVE
Protein, ur: NEGATIVE mg/dL
Urobilinogen, UA: 1 mg/dL (ref 0.0–1.0)

## 2011-02-21 LAB — CBC
HCT: 39 % (ref 36.0–46.0)
HCT: 39.4 % (ref 36.0–46.0)
Hemoglobin: 12.4 g/dL (ref 12.0–15.0)
MCH: 26.1 pg (ref 26.0–34.0)
MCH: 26.4 pg (ref 26.0–34.0)
MCHC: 31 g/dL (ref 30.0–36.0)
MCHC: 31.8 g/dL (ref 30.0–36.0)
MCHC: 32 g/dL (ref 30.0–36.0)
MCV: 83.2 fL (ref 78.0–100.0)
Platelets: 285 10*3/uL (ref 150–400)
Platelets: 310 10*3/uL (ref 150–400)
RBC: 4.48 MIL/uL (ref 3.87–5.11)
RDW: 15.3 % (ref 11.5–15.5)
WBC: 16.8 10*3/uL — ABNORMAL HIGH (ref 4.0–10.5)

## 2011-02-21 LAB — RAPID URINE DRUG SCREEN, HOSP PERFORMED
Amphetamines: NOT DETECTED
Amphetamines: NOT DETECTED
Barbiturates: NOT DETECTED
Benzodiazepines: NOT DETECTED
Benzodiazepines: NOT DETECTED
Cocaine: NOT DETECTED
Opiates: NOT DETECTED
Opiates: NOT DETECTED
Tetrahydrocannabinol: NOT DETECTED
Tetrahydrocannabinol: NOT DETECTED

## 2011-02-21 LAB — URINE MICROSCOPIC-ADD ON

## 2011-02-21 LAB — URINE CULTURE
Colony Count: NO GROWTH
Culture  Setup Time: 201111171251

## 2011-02-21 LAB — POCT CARDIAC MARKERS: Troponin i, poc: <0.05 ng/mL (ref 0.00–0.09)

## 2011-02-21 LAB — CULTURE, RESPIRATORY W GRAM STAIN

## 2011-02-21 LAB — HEMOGLOBIN A1C: Hgb A1c MFr Bld: 5.6 % (ref <5.7)

## 2011-02-23 LAB — URINALYSIS, ROUTINE W REFLEX MICROSCOPIC
Bilirubin Urine: NEGATIVE
Hgb urine dipstick: NEGATIVE
Ketones, ur: NEGATIVE mg/dL
Specific Gravity, Urine: 1.021 (ref 1.005–1.030)
pH: 5.5 (ref 5.0–8.0)

## 2011-02-23 LAB — URINE CULTURE: Culture  Setup Time: 201109070418

## 2011-02-23 LAB — URINE MICROSCOPIC-ADD ON

## 2011-02-26 ENCOUNTER — Emergency Department (HOSPITAL_COMMUNITY)
Admission: EM | Admit: 2011-02-26 | Discharge: 2011-02-26 | Disposition: A | Discharge status: Home / Self Care | Payer: Self-pay | Attending: Emergency Medicine | Admitting: Emergency Medicine

## 2011-02-26 DIAGNOSIS — J45909 Unspecified asthma, uncomplicated: Secondary | ICD-10-CM | POA: Insufficient documentation

## 2011-02-26 DIAGNOSIS — G4730 Sleep apnea, unspecified: Secondary | ICD-10-CM | POA: Insufficient documentation

## 2011-02-26 DIAGNOSIS — E669 Obesity, unspecified: Secondary | ICD-10-CM | POA: Insufficient documentation

## 2011-02-27 ENCOUNTER — Telehealth: Payer: Self-pay | Admitting: Internal Medicine

## 2011-02-27 NOTE — Telephone Encounter (Signed)
Patient had an abnormal mammogram on 02/15/2011 that showed a possible left breast mass, with spot compression views and possible sonography recommended for further evaluation.  Please make sure patient followed up for that evaluation, and please make an appointment in our clinic as well for evaluation.

## 2011-02-28 ENCOUNTER — Ambulatory Visit
Admission: RE | Admit: 2011-02-28 | Discharge: 2011-02-28 | Disposition: A | Discharge status: Home / Self Care | Payer: Self-pay | Source: Ambulatory Visit | Attending: Internal Medicine | Admitting: Internal Medicine

## 2011-02-28 ENCOUNTER — Ambulatory Visit (INDEPENDENT_AMBULATORY_CARE_PROVIDER_SITE_OTHER): Payer: PRIVATE HEALTH INSURANCE | Admitting: Internal Medicine

## 2011-02-28 ENCOUNTER — Encounter: Payer: Self-pay | Admitting: *Deleted

## 2011-02-28 ENCOUNTER — Ambulatory Visit
Admission: RE | Admit: 2011-02-28 | Discharge: 2011-02-28 | Disposition: A | Discharge status: Home / Self Care | Payer: PRIVATE HEALTH INSURANCE | Source: Ambulatory Visit | Attending: Internal Medicine | Admitting: Internal Medicine

## 2011-02-28 ENCOUNTER — Encounter: Payer: Self-pay | Admitting: Ophthalmology

## 2011-02-28 DIAGNOSIS — M67919 Unspecified disorder of synovium and tendon, unspecified shoulder: Secondary | ICD-10-CM

## 2011-02-28 DIAGNOSIS — J449 Chronic obstructive pulmonary disease, unspecified: Secondary | ICD-10-CM

## 2011-02-28 DIAGNOSIS — R928 Other abnormal and inconclusive findings on diagnostic imaging of breast: Secondary | ICD-10-CM

## 2011-02-28 DIAGNOSIS — M7580 Other shoulder lesions, unspecified shoulder: Secondary | ICD-10-CM | POA: Insufficient documentation

## 2011-02-28 LAB — POCT I-STAT, CHEM 8
BUN: 7 mg/dL (ref 6–23)
Calcium, Ion: 1.13 mmol/L (ref 1.12–1.32)
Chloride: 103 mEq/L (ref 96–112)
Creatinine, Ser: 0.9 mg/dL (ref 0.4–1.2)
Glucose, Bld: 95 mg/dL (ref 70–99)
HCT: 41 % (ref 36.0–46.0)
HCT: 42 % (ref 36.0–46.0)
Potassium: 3.5 mEq/L (ref 3.5–5.1)
TCO2: 29 mmol/L (ref 0–100)

## 2011-02-28 LAB — CBC
HCT: 39 % (ref 36.0–46.0)
HCT: 38.5 % (ref 36.0–46.0)
HCT: 39 % (ref 36.0–46.0)
Hemoglobin: 13 g/dL (ref 12.0–15.0)
Hemoglobin: 13.1 g/dL (ref 12.0–15.0)
Hemoglobin: 13.1 g/dL (ref 12.0–15.0)
MCHC: 33.3 g/dL (ref 30.0–36.0)
MCHC: 34 g/dL (ref 30.0–36.0)
MCV: 84.1 fL (ref 78.0–100.0)
RBC: 4.64 MIL/uL (ref 3.87–5.11)
RBC: 4.62 MIL/uL (ref 3.87–5.11)
RDW: 15 % (ref 11.5–15.5)

## 2011-02-28 LAB — POCT PREGNANCY, URINE
Preg Test, Ur: NEGATIVE
Preg Test, Ur: NEGATIVE

## 2011-02-28 LAB — COMPREHENSIVE METABOLIC PANEL
ALT: 22 U/L (ref 0–35)
BUN: 9 mg/dL (ref 6–23)
CO2: 22 mEq/L (ref 19–32)
Calcium: 8.7 mg/dL (ref 8.4–10.5)
Creatinine, Ser: 0.85 mg/dL (ref 0.4–1.2)
GFR calc non Af Amer: >60 mL/min (ref >60)
Glucose, Bld: 90 mg/dL (ref 70–99)

## 2011-02-28 LAB — BASIC METABOLIC PANEL
BUN: 9 mg/dL (ref 6–23)
CO2: 29 mEq/L (ref 19–32)
Calcium: 8.7 mg/dL (ref 8.4–10.5)
Glucose, Bld: 94 mg/dL (ref 70–99)
Sodium: 137 mEq/L (ref 135–145)

## 2011-02-28 LAB — DIFFERENTIAL
Basophils Absolute: 0 10*3/uL (ref 0.0–0.1)
Basophils Absolute: 0.1 10*3/uL (ref 0.0–0.1)
Basophils Relative: 0 % (ref 0–1)
Eosinophils Absolute: 0.4 10*3/uL (ref 0.0–0.7)
Eosinophils Relative: 6 % — ABNORMAL HIGH (ref 0–5)
Eosinophils Relative: 7 % — ABNORMAL HIGH (ref 0–5)
Lymphocytes Relative: 45 % (ref 12–46)
Lymphs Abs: 3.2 10*3/uL (ref 0.7–4.0)
Lymphs Abs: 4.4 10*3/uL — ABNORMAL HIGH (ref 0.7–4.0)
Monocytes Absolute: 0.5 10*3/uL (ref 0.1–1.0)
Monocytes Absolute: 0.6 10*3/uL (ref 0.1–1.0)
Monocytes Relative: 7 % (ref 3–12)
Neutro Abs: 6.1 10*3/uL (ref 1.7–7.7)
Neutro Abs: 3.9 10*3/uL (ref 1.7–7.7)
Neutro Abs: 5.5 10*3/uL (ref 1.7–7.7)
Neutrophils Relative %: 58 % (ref 43–77)

## 2011-02-28 LAB — POCT CARDIAC MARKERS
CKMB, poc: 2.9 ng/mL (ref 1.0–8.0)
Myoglobin, poc: 166 ng/mL (ref 12–200)
Troponin i, poc: <0.05 ng/mL (ref 0.00–0.09)
Troponin i, poc: <0.05 ng/mL (ref 0.00–0.09)

## 2011-02-28 MED ORDER — TRAMADOL HCL 50 MG PO TABS: 50.0000 mg | ORAL_TABLET | Freq: Four times a day (QID) | ORAL | Status: DC | PRN

## 2011-02-28 NOTE — Progress Notes (Signed)
Pt walked into clinic with c/o rt flank pain and pain to rt shoulder.  Onset yesterday. No known injury.  Has tried advil without relief.   States rt arm in dumb and heavy feeling.  Denies dysuria, frequency.

## 2011-02-28 NOTE — Assessment & Plan Note (Addendum)
No wheezing and SOB. Continue bronchodilators and prednisone taper.

## 2011-02-28 NOTE — Patient Instructions (Signed)
Please take all your medications as instructed in your instructions.   Please try to change your working environment and use heating/icy pad.    Please call the Clinic for appointment or go to Emergency Department if your symptoms do not improve or get worse.

## 2011-02-28 NOTE — Telephone Encounter (Signed)
Pt is in clinic today with flank pain and shoulder pain. She did f/u at the breast center and was told she was negative for mass.

## 2011-02-28 NOTE — Progress Notes (Signed)
  Subjective:    Patient ID: Joan Brown, female    DOB: Sep 14, 1971, 40 y.o.   MRN: [id number]  HPI Patient is a 40 years old female with past medical history  as outlined here who comes to the Clinic for right should pain and right buttock pain. 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 She has right shoulder before and had shoulder injection before (medication unknown). She also has right buttock for more than one month. She tried tylenol and naproxen for 2 days and not much helpful. Has no fever, chill, chest pain, shortness of breath, hemoptysis, abdominal pain, nausea, vomiting, diarrhea, melena, dysuria, significant weight change. Denies recent smoking, alcohol or drug abuse. Has been taking all his medications as instructed.  She has been taking prednisone and will finish it in 3 days.    Review of Systems Per HPI.  Current Outpatient Medications Current Outpatient Prescriptions  Medication Sig Dispense Refill  . acetaminophen (TYLENOL) 325 MG tablet Take 2 tablets (650 mg total) by mouth every 4 (four) hours as needed for Pain.  100 tablet  2  . albuterol (PROVENTIL HFA) 108 (90 BASE) MCG/ACT inhaler Inhale 2 puffs into the lungs every 4 (four) hours as needed.  1 Inhaler  6  . albuterol (PROVENTIL) (2.5 MG/3ML) 0.083% nebulizer solution Take 2.5 mg by nebulization every 6 (six) hours as needed.        . budesonide-formoterol (SYMBICORT) 160-4.5 MCG/ACT inhaler Inhale 2 puffs into the lungs 2 (two) times daily.  1 Inhaler  6  . famotidine (PEPCID AC) 10 MG tablet Take 10 mg by mouth 2 (two) times daily with meals.        . hydrochlorothiazide 25 MG tablet Take 0.5 tablets (12.5 mg total) by mouth daily.  30 tablet  6  . omeprazole (PRILOSEC) 20 MG capsule Take 1 capsule (20 mg total) by mouth daily.   30 capsule  6    Allergies Review of patient's allergies indicates no known allergies.  Past Medical History  Diagnosis Date  . COPD (chronic obstructive pulmonary disease)   . Hidradenitis suppurativa   . Asthma     PFT 6/11 FEV1 76%, 13% chagne sp BD.   Marland Kitchen Benign essential hypertension   . Morbid obesity   . Chest pain   . Obstructive sleep apnea     No past surgical history on file.     Objective:   Physical Exam General: Vital signs reviewed and noted. Well-developed,well-nourished,in no acute distress; alert,appropriate and cooperative throughout examination. Head: normocephalic, atraumatic. Neck: No deformities, masses, or tenderness noted. Lungs: Normal respiratory effort. Clear to auscultation BL without crackles or wheezes.  Heart: RRR. S1 and S2 normal without gallop, murmur, or rubs.  Abdomen: BS normoactive. Soft, Nondistended, non-tender.  No masses or organomegaly. Extremities: No pretibial edema. Right shoulder muscle tenderness and passive pain esp on raising shoulder. Right mid buttock tenderness, no erythema or swelling. Leg straight raising test negative.          Assessment & Plan:

## 2011-02-28 NOTE — Assessment & Plan Note (Addendum)
Her symptom is likely due to rotator cuff tendinitis from her work. Since she is on prednisone, so will give her tramadol for pain control. Also advised her to use heating/icy pad and change her job assignment if possible. If no improvement, will refer to sports medicine.

## 2011-03-13 ENCOUNTER — Encounter: Payer: Self-pay | Admitting: Adult Health

## 2011-03-13 ENCOUNTER — Ambulatory Visit (INDEPENDENT_AMBULATORY_CARE_PROVIDER_SITE_OTHER): Payer: Self-pay | Admitting: Adult Health

## 2011-03-13 VITALS — BP 122/74 | HR 82 | Temp 98.6°F | Ht 65.0 in | Wt 272.2 lb

## 2011-03-13 DIAGNOSIS — J45909 Unspecified asthma, uncomplicated: Secondary | ICD-10-CM

## 2011-03-13 MED ORDER — HYDROCODONE-HOMATROPINE 5-1.5 MG/5ML PO SYRP: 5.0000 mL | ORAL_SOLUTION | ORAL | Status: AC | PRN

## 2011-03-13 MED ORDER — CHLORPHENIRAMINE MALEATE 4 MG PO TABS: ORAL_TABLET | ORAL | Status: DC

## 2011-03-13 MED ORDER — BUDESONIDE-FORMOTEROL FUMARATE 160-4.5 MCG/ACT IN AERO: 2.0000 | INHALATION_SPRAY | Freq: Two times a day (BID) | RESPIRATORY_TRACT | Status: DC

## 2011-03-13 MED ORDER — ALBUTEROL SULFATE (2.5 MG/3ML) 0.083% IN NEBU: 2.5000 mg | INHALATION_SOLUTION | Freq: Four times a day (QID) | RESPIRATORY_TRACT | Status: DC | PRN

## 2011-03-13 MED ORDER — DEXTROMETHORPHAN POLISTIREX 30 MG/5ML PO LQCR: 60.0000 mg | Freq: Two times a day (BID) | ORAL | Status: AC | PRN

## 2011-03-13 MED ORDER — CETIRIZINE HCL 10 MG PO CHEW: 10.0000 mg | CHEWABLE_TABLET | Freq: Every day | ORAL | Status: DC

## 2011-03-13 NOTE — Progress Notes (Signed)
Addended by: Boone Master on: 03/13/2011 03:05 PM   Modules accepted: Orders

## 2011-03-13 NOTE — Assessment & Plan Note (Addendum)
Uncontrolled with recurrent exacerbation requiring steroids with associated rhinitis and upper airway cough syndrome.  Recent cxr 02/09/11 reveiwed w/ no acute process.  Advised on cough suppression regimen, rhinitis and gerd prevention  Restart Symbicort.  Plan:  Begin Zyrtec10mg  daily.  Chlor tabs 4mg  2 tabs at bedtime Use water, sugarless candy to help avoid cough or throat clearing.  No mints.  Delsym 2 tsp Twice daily  For cough  May use Hydromet 1-2 tsp every 4hr  As needed for cough, may make you sleepy.  Restart Symbicort 2 puffs Twice daily  -brush/rinse/gargle after use.  follow up Dr. Vassie Loll  In 3 weeks  Please contact office for sooner follow up if symptoms do not improve or worsen or seek emergency care

## 2011-03-13 NOTE — Patient Instructions (Signed)
Begin Zyrtec10mg  daily.  Chlor tabs 4mg  2 tabs at bedtime Use water, sugarless candy to help avoid cough or throat clearing.  No mints.  Delsym 2 tsp Twice daily  For cough  May use Hydromet 1-2 tsp every 4hr  As needed for cough, may make you sleepy.  Restart Symbicort 2 puffs Twice daily  -brush/rinse/gargle after use.  follow up Dr. Vassie Loll  In 3 weeks  Please contact office for sooner follow up if symptoms do not improve or worsen or seek emergency care

## 2011-03-13 NOTE — Progress Notes (Signed)
Subjective:    Patient ID: Joan Brown, female    DOB: 1970-12-17, 40 y.o.   MRN: [id number]  HPI 40/F , ex -smoker, obese for evaluation of cough & 'asthma'.  She depends on samples and does not have insurance -she is waiting on her medicaid right now.  She does not have a PMD & has frequent ER visits.   05/05/10--She has been evaluated by Dr Donnie Aho who feels she may have inflamed bronchial tubes. Repeated CXrs were nml as were CT angiograms on 04/02/10 & 04/15/10. She reports 'asthma' & bronchitis since childhood but worse in the last year. She reports non productive cough without diurnal or seasonal variation. She has used albuterol & pulmicort nebs from her cousin's daughter with some relief. She has used her cousin's spiriva samples with some relief. Dexilant was prescribed by dr Donnie Aho. he feels she does not have ischemic heart disease or pericarditis.  -started on Symbicort 160/4.58mcg . Spiriva and pulmicort stopped.   July 01, 2010 -- Cough & dyspnea better. has been unable to enroll with [facility]. Sharing meds with fiance.  Her PFT showed FEV1 2.20 l/m (76%) , s/p bronchodilator w 13% change. I reviewed her PFT s results. Reports excessive daytime somnolence, loud snoring, choking episodes that wake her up.  Home study showed mild obstructive sleep apnea - stopped/ slowed breathing 8/h , weight loss advised   October 12, 2010 -- ER visit for dyspnea -given pred  quit smoking, could not get symbicort filled, got orange card -hope to start with [facility] & start getting prescriptions filled. Using nebs 4-5 times daily, Bouts of coughing in the office  03/13/2011 Follow up - Presents for follow up. She has been having more for last month with high pollen count. She has been in ER x 2 . CXR was neg for acute process. Cough comes and goes- worse with eating /drinking and night. NO fever, or discololred mucus. Has a lot of dryness in throat with throat clearing and dry cough. Was treated with  steroid x 2 in ER. Gets meds thru [facility] and orange card -Gets Symbicort free. But ran out 3 days ago, was busy and could not get to pick it up. Has good and bad days with cough-trys to work thru it.     Review of Systems   Constitutional:   No  weight loss, night sweats,   HEENT:   No headaches,  Difficulty swallowing,  Tooth/dental problems,  Sore throat,                No sneezing, itching, ear ache,    CV:  No chest pain,  Orthopnea, PND, swelling in lower extremities, anasarca, dizziness, palpitations  GI  No heartburn, indigestion, abdominal pain, nausea, vomiting, diarrhea, change in bowel habits, loss of appetite  Resp: No shortness of breath with exertion or at rest. No coughing up of blood.  No change in color of mucus.  No wheezing.  No chest wall deformity  Skin: no rash or lesions.  GU: no dysuria, change in color of urine, no urgency or frequency.  No flank pain.  MS:  No joint pain or swelling.  No decreased range of motion.  No back pain.  Psych:  No change in mood or affect. No depression or anxiety.  No memory loss.   Objective:   Physical Exam Gen: Pleasant, well-nourished-obese , in no distress,  normal affect  ENT: No lesions,  mouth clear,  oropharynx clear   Neck: No JVD,  no TMG, no carotid bruits  Lungs:Coarse BS w/ harsh barking cough, no wheezing   Cardiovascular: RRR, heart sounds normal, no murmur or gallops, no peripheral edema  Abdomen: soft and NT, no HSM,  BS normal  Musculoskeletal: No deformities, no cyanosis or clubbing  Neuro: alert, non focal  Skin: Warm, no lesions or rashes         Assessment & Plan:

## 2011-04-05 ENCOUNTER — Encounter: Payer: Self-pay | Admitting: Pulmonary Disease

## 2011-04-07 ENCOUNTER — Encounter: Payer: Self-pay | Admitting: Pulmonary Disease

## 2011-04-07 ENCOUNTER — Ambulatory Visit (INDEPENDENT_AMBULATORY_CARE_PROVIDER_SITE_OTHER): Payer: Self-pay | Admitting: Pulmonary Disease

## 2011-04-07 DIAGNOSIS — G4730 Sleep apnea, unspecified: Secondary | ICD-10-CM

## 2011-04-07 DIAGNOSIS — J45909 Unspecified asthma, uncomplicated: Secondary | ICD-10-CM

## 2011-04-07 NOTE — Patient Instructions (Addendum)
Start on singulair 10 mg daily (samples) If no side effects, call us so we can send in RX Stay on symbicort 2 puffs twice daily Stay on allergy & reflux pills

## 2011-04-07 NOTE — Progress Notes (Signed)
  Subjective:    Patient ID: Joan Brown, female    DOB: 09-03-1971, 40 y.o.   MRN: [id number]  HPI 40/F , ex -smoker, obese for FU of recurrent episodic cough/ wheezing being treated as asthma.  She  has frequent ER visits.   05/05/10--She has been evaluated by Dr Donnie Aho who feels she may have inflamed bronchial tubes. Repeated CXrs were nml as were CT angiograms on 04/02/10 & 04/15/10. She reports 'asthma' & bronchitis since childhood . She reports non productive cough without diurnal or seasonal variation.  -started on Symbicort 160/4.17mcg . Spiriva and pulmicort stopped.  PFT showed FEV1 2.20 l/m (76%) , s/p bronchodilator w 13% change.Home sleep study showed mild obstructive sleep apnea - stopped/ slowed breathing 8/h , weight loss advised   October 12, 2010 --  ER visit for dyspnea -given pred    04/07/2011 ER visits x 2 this year.  Seen on 4/ for acute visit >> Begin Zyrtec10mg  daily.  Better - delsym & mucinex did not help, has not filled in hydromet C/o chest rattling, back on symbicort & is now able to get this from [facility] Denies nocturnal symptoms    Review of Systems Pt denies any significant  nasal congestion or excess secretions, fever, chills, sweats, unintended wt loss, pleuritic or exertional cp, orthopnea pnd or leg swelling.  Pt also denies any obvious fluctuation in symptoms with weather or environmental change or other alleviating or aggravating factors.    Pt denies any increase in rescue therapy over baseline, denies waking up needing it or having early am exacerbations or coughing/wheezing/ or dyspnea       Objective:   Physical Exam    Gen. Pleasant, obese, in no distress ENT - no lesions, no post nasal drip Neck: No JVD, no thyromegaly, no carotid bruits Lungs: no use of accessory muscles, no dullness to percussion, clear without rales or rhonchi  Cardiovascular: Rhythm regular, heart sounds  normal, no murmurs or gallops, no peripheral  edema Musculoskeletal: No deformities, no cyanosis or clubbing      Assessment & Plan:

## 2011-04-07 NOTE — Assessment & Plan Note (Signed)
It is possible that the home study underestimates degree of sleep disordered breathing. If she remains symptomatic, may have to revisit this issue with in lab study

## 2011-04-07 NOTE — Assessment & Plan Note (Signed)
Will treat as asthma given recurrent cough/ wheezing & marginal reversibility on PFTs. Can use methacholine challenge to clarify  For now step up therapy by adding singulair Ct symbicort - hopefully if she can stay complaint with this & use albuterol nebs for rescue , we can avoid ER visits. I have discussed plan for exacerbation. Goal is to avoid PO steroids here unless she clearly has bronchospasm

## 2011-05-09 ENCOUNTER — Ambulatory Visit: Payer: Self-pay | Admitting: Adult Health

## 2011-05-15 ENCOUNTER — Ambulatory Visit: Payer: Self-pay | Admitting: Adult Health

## 2011-05-23 ENCOUNTER — Ambulatory Visit: Payer: Self-pay | Admitting: Adult Health

## 2011-05-29 ENCOUNTER — Encounter: Payer: Self-pay | Admitting: Adult Health

## 2011-05-29 ENCOUNTER — Ambulatory Visit (INDEPENDENT_AMBULATORY_CARE_PROVIDER_SITE_OTHER): Payer: Self-pay | Admitting: Adult Health

## 2011-05-29 ENCOUNTER — Other Ambulatory Visit: Payer: Self-pay | Admitting: *Deleted

## 2011-05-29 VITALS — BP 120/84 | HR 67 | Temp 97.0°F | Ht 66.0 in | Wt 272.8 lb

## 2011-05-29 DIAGNOSIS — J45909 Unspecified asthma, uncomplicated: Secondary | ICD-10-CM

## 2011-05-29 MED ORDER — ALBUTEROL SULFATE (2.5 MG/3ML) 0.083% IN NEBU: 2.5000 mg | INHALATION_SOLUTION | Freq: Four times a day (QID) | RESPIRATORY_TRACT | Status: DC | PRN

## 2011-05-29 MED ORDER — PREDNISONE 10 MG PO TABS: ORAL_TABLET | ORAL | Status: AC

## 2011-05-29 MED ORDER — AMOXICILLIN-POT CLAVULANATE 875-125 MG PO TABS: 1.0000 | ORAL_TABLET | Freq: Two times a day (BID) | ORAL | Status: AC

## 2011-05-29 NOTE — Progress Notes (Signed)
  Subjective:    Patient ID: Joan Brown, female    DOB: 07-30-1971, 40 y.o.   MRN: [id number]  HPI  40/F , ex -smoker, obese for FU of recurrent episodic cough/ wheezing being treated as asthma.  She  has frequent ER visits.   05/05/10--She has been evaluated by Dr Donnie Aho who feels she may have inflamed bronchial tubes. Repeated CXrs were nml as were CT angiograms on 04/02/10 & 04/15/10. She reports 'asthma' & bronchitis since childhood . She reports non productive cough without diurnal or seasonal variation.  -started on Symbicort 160/4.85mcg . Spiriva and pulmicort stopped.  PFT showed FEV1 2.20 l/m (76%) , s/p bronchodilator w 13% change.Home sleep study showed mild obstructive sleep apnea - stopped/ slowed breathing 8/h , weight loss advised   October 12, 2010 --  ER visit for dyspnea -given pred   03/28/11  ER visits x 2 this year.  Seen on 4/ for acute visit >> Begin Zyrtec10mg  daily.  Better - delsym & mucinex did not help, has not filled in hydromet C/o chest rattling, back on symbicort & is now able to get this from [facility] Denies nocturnal symptoms  05/29/11 Follow up  Returns for follow up. Says she was doing okay until last 2 weeks. Has had increased SOB, wheezing, prod cough with yellow mucus x1 month. Mucus is very thick and stringy. Has some drainage. OTC not helping. Cough worse in am . Wheezing worse last 2 days.  No chest pain or edema .    Review of Systems . Constitutional:   No  weight loss, night sweats,  Fevers, chills, fatigue, or  lassitude.  HEENT:   No headaches,  Difficulty swallowing,  Tooth/dental problems, or  Sore throat,                No sneezing, itching, ear ache, nasal congestion,  +post nasal drip,   CV:  No chest pain,  Orthopnea, PND, swelling in lower extremities, anasarca, dizziness, palpitations, syncope.   GI  No heartburn, indigestion, abdominal pain, nausea, vomiting, diarrhea, change in bowel habits, loss of appetite, bloody stools.    Resp:   No coughing up of blood.    No chest wall deformity  Skin: no rash or lesions.  GU: no dysuria, change in color of urine, no urgency or frequency.  No flank pain, no hematuria   MS:  No joint pain or swelling.  No decreased range of motion.  No back pain.  Psych:  No change in mood or affect. No depression or anxiety.  No memory loss.        Objective:   Physical Exam  GEN: A/Ox3; pleasant , NAD, obese   HEENT:  [place]/AT,  EACs-clear, TMs-wnl, NOSE-clear drainage , THROAT-clear, no lesions, no postnasal drip or exudate noted.   NECK:  Supple w/ fair ROM; no JVD; normal carotid impulses w/o bruits; no thyromegaly or nodules palpated; no lymphadenopathy.  RESP  Coarse BS w/ no wheezing no accessory muscle use, no dullness to percussion  CARD:  RRR, no m/r/g  , no peripheral edema, pulses intact, no cyanosis or clubbing.  GI:   Soft & nt; nml bowel sounds; no organomegaly or masses detected.  Musco: Warm bil, no deformities or joint swelling noted.   Neuro: alert, no focal deficits noted.    Skin: Warm, no lesions or rashes          Assessment & Plan:

## 2011-05-29 NOTE — Patient Instructions (Addendum)
Augmentin 875mg  Twice daily  For 7 days Mucinex DM  Twice daily  As needed   For cough/congestion Prednisone taper over next week.  Please contact office for sooner follow up if symptoms do not improve or worsen or seek emergency care  follow up Dr. Vassie Loll  In 6 weeks and As needed

## 2011-05-29 NOTE — Assessment & Plan Note (Signed)
Asthma flare with URI   Plan:  Augmentin 875mg  Twice daily  For 7 days Mucinex DM  Twice daily  As needed   For cough/congestion Prednisone taper over next week.  Please contact office for sooner follow up if symptoms do not improve or worsen or seek emergency care  follow up Dr. Vassie Loll  In 6 weeks and As needed

## 2011-06-27 ENCOUNTER — Encounter: Payer: Self-pay | Admitting: Internal Medicine

## 2011-06-27 ENCOUNTER — Ambulatory Visit (INDEPENDENT_AMBULATORY_CARE_PROVIDER_SITE_OTHER): Payer: Self-pay | Admitting: Internal Medicine

## 2011-06-27 VITALS — BP 141/89 | HR 77 | Temp 98.3°F | Ht 65.0 in | Wt 276.7 lb

## 2011-06-27 DIAGNOSIS — I10 Essential (primary) hypertension: Secondary | ICD-10-CM

## 2011-06-27 DIAGNOSIS — B369 Superficial mycosis, unspecified: Secondary | ICD-10-CM

## 2011-06-27 DIAGNOSIS — R209 Unspecified disturbances of skin sensation: Secondary | ICD-10-CM

## 2011-06-27 DIAGNOSIS — M25569 Pain in unspecified knee: Secondary | ICD-10-CM

## 2011-06-27 DIAGNOSIS — G4730 Sleep apnea, unspecified: Secondary | ICD-10-CM

## 2011-06-27 DIAGNOSIS — R200 Anesthesia of skin: Secondary | ICD-10-CM

## 2011-06-27 MED ORDER — HYDROCHLOROTHIAZIDE 25 MG PO TABS: 25.0000 mg | ORAL_TABLET | Freq: Every day | ORAL | Status: DC

## 2011-06-27 NOTE — Assessment & Plan Note (Signed)
Patient reports swelling of feet, sometimes to the level of her ankles.  Suspect benign etiology, no pulmonary edema, cardiac abnormalities, or known liver disease.  Some numbness noted on soles of feet on exam, could represent new diagnosis of diabetes vs thickened skin on soles of feet. -increased HCTZ to 25 mg/day -will investigate for laboratory abnormalities, with cmp, TSH, Hb A1c

## 2011-06-27 NOTE — Assessment & Plan Note (Signed)
Home sleep study showed mild OSA, patient still symptomatic -referral sent for split sleep study, for official diagnosis and CPAP titration

## 2011-06-27 NOTE — Patient Instructions (Addendum)
For your feet swelling and your blood pressure, we have increased your dose of Hydrochlorothiazide to 25 mg per day -we are also checking some basic lab work today  For your knee pain, please schedule an appointment with Orthopedic Surgery for further evaluation and treatment  For your sleep apnea, please schedule an appointment for an overnight sleep study, both to formally diagnose sleep apnea and to calibrate a CPAP machine for you  Your feet likely have a common foot fungus.  Continue to use your antifungal cream, and keep the area dry and clean. -please return for a follow-up if the areas do not resolve  Please return for a follow-up appointment in 2-3 months.

## 2011-06-27 NOTE — Assessment & Plan Note (Addendum)
BP mildly elevated today to 141/89 -will increase HCTZ to 25 mg/day -check bmet at next visit

## 2011-06-27 NOTE — Assessment & Plan Note (Signed)
Hyperpigmented, scaling skin on feet consistent with fungal infection (ie tenia pedis) -improving with OTC antifungal cream -continue OTC antifungal cream, will re-evaluate if area worsens -keep the area dry and clean
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
## 2011-06-28 LAB — COMPLETE METABOLIC PANEL WITH GFR
Albumin: 4.1 g/dL (ref 3.5–5.2)
BUN: 11 mg/dL (ref 6–23)
CO2: 25 mEq/L (ref 19–32)
Calcium: 9.3 mg/dL (ref 8.4–10.5)
Chloride: 104 mEq/L (ref 96–112)
GFR, Est Non African American: >60 mL/min (ref >60)
Glucose, Bld: 90 mg/dL (ref 70–99)
Potassium: 4.3 mEq/L (ref 3.5–5.3)

## 2011-06-29 NOTE — Progress Notes (Signed)
I saw patient and discussed her care with resident Dr. Brown.  I agree with the clinical findings and plans as outlined in his note.  

## 2011-06-30 NOTE — Progress Notes (Signed)
Addended by: Maura Crandall on: 06/30/2011 04:43 PM   Modules accepted: Orders

## 2011-07-05 ENCOUNTER — Ambulatory Visit (HOSPITAL_BASED_OUTPATIENT_CLINIC_OR_DEPARTMENT_OTHER): Payer: Self-pay | Attending: Internal Medicine

## 2011-07-05 DIAGNOSIS — G4733 Obstructive sleep apnea (adult) (pediatric): Secondary | ICD-10-CM | POA: Insufficient documentation

## 2011-07-09 DIAGNOSIS — R0989 Other specified symptoms and signs involving the circulatory and respiratory systems: Secondary | ICD-10-CM

## 2011-07-09 DIAGNOSIS — R0609 Other forms of dyspnea: Secondary | ICD-10-CM

## 2011-07-09 DIAGNOSIS — G4730 Sleep apnea, unspecified: Secondary | ICD-10-CM

## 2011-07-09 DIAGNOSIS — G4710 Hypersomnia, unspecified: Secondary | ICD-10-CM

## 2011-07-09 NOTE — Procedures (Signed)
NAME:  NIEMAH, SCHWEBKE                 ACCOUNT NO.:  [id number]  MEDICAL RECORD NO.:  [id number]          PATIENT TYPE:  OUT  LOCATION:  SLEEP CENTER                 FACILITY:  [facility]  PHYSICIAN:  Monette Omara D. Maple Hudson, MD, FCCP, FACPDATE OF BIRTH:  August 05[birth year] 1969  DATE OF STUDY:  07/05/2011                           NOCTURNAL POLYSOMNOGRAM  REFERRING PHYSICIAN:  RYAN BROWN  INDICATION FOR STUDY:  Hypersomnia with sleep apnea.  EPWORTH SLEEPINESS SCORE:  Epworth sleepiness score 7/24, BMI 45.3. Weight 272 pounds, height 65 inches.  Neck 15 inches.  Home medications are charted and reviewed.  MEDICATIONS:  SLEEP ARCHITECTURE:  Total sleep time 253.5 minutes with sleep efficiency 71.4%.  Stage I was 13%, stage II 66.5%, stage III absent, REM 20.5% of total sleep time.  Sleep latency 15.5 minutes, REM latency 146 minutes, awake after sleep onset 84 minutes, arousal index 11.8. Bedtime medication:  Albuterol by nebulizer, chlorpheniramine, hydrochlorothiazide, and albuterol nebulizer was repeated at 1:41 a.m.  RESPIRATORY DATA:  Apnea/hypopnea index (AHI) 3.3 per hour.  A total of 14 events were scored including 1 mixed apnea and 13 hypopneas, all associated with nonsupine sleep and mostly associated with REM.  REM AHI 12.7 per hour.  There were insufficient numbers of events to permit application of CPAP titration by split protocol on the study night.  OXYGEN DATA:  Moderately loud snoring with oxygen desaturation to a nadir of 86% and a mean oxygen saturation through the study of 95.4% on room air.  CARDIAC DATA:  Normal sinus rhythm.  MOVEMENT-PARASOMNIA:  No significant movement disturbance.  Bathroom x1.  IMPRESSIONS-RECOMMENDATIONS: 1. Sleep architecture was notable for an interval of nearly 1 hour,     awake between 1 and 2 a.m. associated with use of albuterol by     nebulizer for the second time that evening.  Technician commented     the patient had frequent coughing spells due to  asthma. 2. Occasional respiratory events with sleep disturbance, within normal     limits, AHI 3.3 per hour (normal range 0-     5 per hour).  Moderately loud snoring with oxygen desaturation to a     nadir of 86% and a mean oxygen saturation through the study of     95.4% on room air.     Brennyn Haisley D. Maple Hudson, MD, [facility], FACP Diplomate, Biomedical engineer of Sleep Medicine Electronically Signed    CDY/MEDQ  D:  07/09/2011 09:05:21  T:  07/09/2011 15:02:08  Job:  [id number]

## 2011-07-17 ENCOUNTER — Encounter: Payer: Self-pay | Admitting: Internal Medicine

## 2011-07-17 NOTE — Progress Notes (Signed)
Results of sleep study received, patient had 3.3 AHI per hour, which did not qualify her for a diagnosis of sleep apnea.  Patient called and informed of results via answering machine message.  We will discuss other treatments for her difficulty sleeping at her next visit.

## 2011-08-10 ENCOUNTER — Encounter: Payer: Self-pay | Admitting: Internal Medicine

## 2011-09-01 ENCOUNTER — Encounter: Payer: Self-pay | Admitting: Internal Medicine

## 2011-09-12 LAB — POCT I-STAT, CHEM 8
Creatinine, Ser: 1.2
Hemoglobin: 13.6
Potassium: 3.6
Sodium: 139
TCO2: 26

## 2011-09-12 LAB — CBC
MCV: 84.3
Platelets: 333
RDW: 14.8
WBC: 10.7 — ABNORMAL HIGH

## 2011-09-12 LAB — DIFFERENTIAL
Basophils Absolute: 0
Eosinophils Absolute: 0.2
Lymphocytes Relative: 27
Lymphs Abs: 2.9
Neutrophils Relative %: 69

## 2011-09-12 LAB — POCT CARDIAC MARKERS
CKMB, poc: 1
Myoglobin, poc: 110
Troponin i, poc: <0.05

## 2011-09-14 ENCOUNTER — Telehealth: Payer: Self-pay | Admitting: *Deleted

## 2011-09-14 NOTE — Telephone Encounter (Signed)
Pt called with c/o find  Blisters on both hands. Onset yesterday. No known cause, no Hx.  Also has rash on fingers with burning sensation for a month.  Will see on Tuesday.

## 2011-09-15 NOTE — Telephone Encounter (Signed)
Pt informed and will let us know if rash/blisters get worse.  She will go to [facility] if needed.

## 2011-09-15 NOTE — Telephone Encounter (Signed)
If gets worse should be seen in ED or UC.

## 2011-09-19 ENCOUNTER — Ambulatory Visit: Payer: Self-pay | Admitting: Internal Medicine

## 2011-09-22 ENCOUNTER — Ambulatory Visit: Payer: Self-pay | Admitting: Pulmonary Disease

## 2011-09-28 ENCOUNTER — Encounter: Payer: Self-pay | Admitting: Internal Medicine

## 2011-09-28 ENCOUNTER — Ambulatory Visit: Payer: Self-pay | Admitting: Adult Health

## 2011-09-28 ENCOUNTER — Ambulatory Visit (INDEPENDENT_AMBULATORY_CARE_PROVIDER_SITE_OTHER): Payer: Self-pay | Admitting: Internal Medicine

## 2011-09-28 VITALS — BP 120/80 | HR 77 | Temp 98.2°F | Wt 267.6 lb

## 2011-09-28 DIAGNOSIS — Z23 Encounter for immunization: Secondary | ICD-10-CM

## 2011-09-28 DIAGNOSIS — I10 Essential (primary) hypertension: Secondary | ICD-10-CM

## 2011-09-28 DIAGNOSIS — B353 Tinea pedis: Secondary | ICD-10-CM

## 2011-09-28 DIAGNOSIS — J45909 Unspecified asthma, uncomplicated: Secondary | ICD-10-CM

## 2011-09-28 DIAGNOSIS — B369 Superficial mycosis, unspecified: Secondary | ICD-10-CM

## 2011-09-28 MED ORDER — OMEPRAZOLE 20 MG PO CPDR: 20.0000 mg | DELAYED_RELEASE_CAPSULE | Freq: Every day | ORAL | Status: DC

## 2011-09-28 MED ORDER — BUDESONIDE-FORMOTEROL FUMARATE 160-4.5 MCG/ACT IN AERO: 2.0000 | INHALATION_SPRAY | Freq: Two times a day (BID) | RESPIRATORY_TRACT | Status: DC

## 2011-09-28 MED ORDER — TERBINAFINE HCL 250 MG PO TABS: 250.0000 mg | ORAL_TABLET | Freq: Every day | ORAL | Status: DC

## 2011-09-28 MED ORDER — HYDROCHLOROTHIAZIDE 25 MG PO TABS: 25.0000 mg | ORAL_TABLET | Freq: Every day | ORAL | Status: DC

## 2011-09-28 MED ORDER — ALBUTEROL SULFATE HFA 108 (90 BASE) MCG/ACT IN AERS: 2.0000 | INHALATION_SPRAY | RESPIRATORY_TRACT | Status: DC | PRN

## 2011-09-28 MED ORDER — ALBUTEROL SULFATE (2.5 MG/3ML) 0.083% IN NEBU: 2.5000 mg | INHALATION_SOLUTION | Freq: Four times a day (QID) | RESPIRATORY_TRACT | Status: DC | PRN

## 2011-09-28 NOTE — Progress Notes (Signed)
Subjective:   Patient ID: Joan Brown female   DOB: Aug 08, 1971 40 y.o.   MRN: [id number]  HPI: [name] is a 40 y.o. female with PMH significant as outlined below who presented to the clinic for a follow up of a lesion on her feet  1. Lesion: it  was evaluated back in July and was found to be fungal infection . She noted that she used over the counter antifungal cremes as recommended. The lesion on the feet worsen and are now on the hand too. Denies any pruritis or pain. Denies any changes in diet, detergent or any other chagnes   2. Knee pain: She was advised to follow up with Orthopedics for knee pain at the last office visit but did not . Due to time issue, I will refer this to the office visit.     Past Medical History  Diagnosis Date  . COPD (chronic obstructive pulmonary disease)   . Hidradenitis suppurativa   . Asthma     PFT 6/11 FEV1 76%, 13% chagne sp BD.   Marland Kitchen Benign essential hypertension   . Morbid obesity   . Chest pain   . Obstructive sleep apnea    Current Outpatient Prescriptions  Medication Sig Dispense Refill  . acetaminophen (TYLENOL) 325 MG tablet Take 2 tablets (650 mg total) by mouth every 4 (four) hours as needed for Pain.  100 tablet  2  . albuterol (PROVENTIL HFA) 108 (90 BASE) MCG/ACT inhaler Inhale 2 puffs into the lungs every 4 (four) hours as needed.  1 Inhaler  6  . albuterol (PROVENTIL) (2.5 MG/3ML) 0.083% nebulizer solution Take 3 mLs (2.5 mg total) by nebulization every 6 (six) hours as needed.  120 vial  6  . budesonide-formoterol (SYMBICORT) 160-4.5 MCG/ACT inhaler Inhale 2 puffs into the lungs 2 (two) times daily.  1 Inhaler  6  . cetirizine (ZYRTEC) 10 MG chewable tablet Chew 1 tablet (10 mg total) by mouth daily.      . chlorpheniramine (CHLOR-TRIMETON) 4 MG tablet 2 at bedtime    0  . cyclobenzaprine (FLEXERIL) 10 MG tablet Take 10 mg by mouth every 6 (six) hours as needed.        . famotidine (PEPCID AC) 10 MG tablet Take 10 mg by mouth 2  (two) times daily with meals.        . hydrochlorothiazide 25 MG tablet Take 1 tablet (25 mg total) by mouth daily.  30 tablet  11  . omeprazole (PRILOSEC) 20 MG capsule Take 1 capsule (20 mg total) by mouth daily.  30 capsule  6  . traMADol (ULTRAM) 50 MG tablet Take 1 tablet (50 mg total) by mouth every 6 (six) hours as needed for pain.  50 tablet  1   Review of Systems: Constitutional: Denies fever, chills, diaphoresis, appetite change and fatigue. Marland Kitchen   Respiratory: Denies SOB, DOE, cough, chest tightness,  and wheezing.   Cardiovascular: Denies chest pain, palpitations and leg swelling.  Gastrointestinal: Denies nausea, vomiting, abdominal pain, diarrhea, constipation, blood in stool and abdominal distention.  Genitourinary: Denies dysuria, urgency, frequency, hematuria, flank pain and difficulty urinating.  Musculoskeletal: Denies myalgias, back pain, joint swelling, arthralgias and gait problem.  Neurological: Denies dizziness, seizures, syncope, weakness, light-headedness, numbness and headaches.  Hematological: Denies adenopathy. Easy bruising, personal or family bleeding history   Objective:  Physical Exam: Constitutional: Vital signs reviewed.  Patient is a well-developed and well-nourished in no acute distress and cooperative with exam. Alert and oriented x3.  Neck: Supple, Trachea midline normal ROM, No JVD, mass, thyromegaly, or carotid bruit present.  Cardiovascular: RRR, S1 normal, S2 normal, no MRG, pulses symmetric and intact bilaterally Pulmonary/Chest: CTAB, no wheezes, rales, or rhonchi Abdominal: Soft. Non-tender, non-distended, bowel sounds are normal, no masses, organomegaly, or guarding present.  Neurological: A&O x3,  no focal motor deficit,  Skin: scaling lesion on top of foot and interdigital fissura but also some lesion on the both hands noted. Some sharp markings are noted.

## 2011-09-28 NOTE — Assessment & Plan Note (Signed)
Blood pressure well controlled on current regimen. Will continue and check Bmet for possible changes in management.

## 2011-09-28 NOTE — Assessment & Plan Note (Signed)
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 I will therefore start patient on po antifungal therapy with terbinafine for 2 weeks and have a reevaluate her in the clinic at that time for possible changes in management.

## 2011-09-29 LAB — BASIC METABOLIC PANEL
BUN: 12 mg/dL (ref 6–23)
Calcium: 9.3 mg/dL (ref 8.4–10.5)
Chloride: 102 mEq/L (ref 96–112)
Creat: 0.86 mg/dL (ref 0.50–1.10)

## 2011-10-26 ENCOUNTER — Ambulatory Visit: Payer: Self-pay | Admitting: Adult Health

## 2011-11-12 ENCOUNTER — Emergency Department (HOSPITAL_COMMUNITY)
Admission: EM | Admit: 2011-11-12 | Discharge: 2011-11-12 | Disposition: A | Discharge status: Home / Self Care | Payer: Self-pay | Attending: Emergency Medicine | Admitting: Emergency Medicine

## 2011-11-12 ENCOUNTER — Encounter (HOSPITAL_COMMUNITY): Payer: Self-pay | Admitting: *Deleted

## 2011-11-12 DIAGNOSIS — J4489 Other specified chronic obstructive pulmonary disease: Secondary | ICD-10-CM | POA: Insufficient documentation

## 2011-11-12 DIAGNOSIS — J40 Bronchitis, not specified as acute or chronic: Secondary | ICD-10-CM | POA: Insufficient documentation

## 2011-11-12 DIAGNOSIS — J449 Chronic obstructive pulmonary disease, unspecified: Secondary | ICD-10-CM | POA: Insufficient documentation

## 2011-11-12 DIAGNOSIS — R0602 Shortness of breath: Secondary | ICD-10-CM | POA: Insufficient documentation

## 2011-11-12 DIAGNOSIS — I10 Essential (primary) hypertension: Secondary | ICD-10-CM | POA: Insufficient documentation

## 2011-11-12 DIAGNOSIS — R109 Unspecified abdominal pain: Secondary | ICD-10-CM | POA: Insufficient documentation

## 2011-11-12 DIAGNOSIS — R062 Wheezing: Secondary | ICD-10-CM | POA: Insufficient documentation

## 2011-11-12 MED ORDER — AZITHROMYCIN 250 MG PO TABS: 250.0000 mg | ORAL_TABLET | Freq: Every day | ORAL | Status: AC

## 2011-11-12 MED ORDER — HYDROCOD POLST-CHLORPHEN POLST 10-8 MG/5ML PO LQCR: 5.0000 mL | Freq: Two times a day (BID) | ORAL | Status: DC | PRN

## 2011-11-12 NOTE — ED Notes (Signed)
SOB, productive cough, HA, neck tightness X2 days.

## 2011-11-12 NOTE — ED Provider Notes (Signed)
History     CSN: [id number] Arrival date & time: 11/12/2011  1:20 PM   First MD Initiated Contact with Patient 11/12/11 1338      Chief Complaint  Patient presents with  . Sore Throat  . Cough  . Shortness of Breath    (Consider location/radiation/quality/duration/timing/severity/associated sxs/prior treatment) HPI Comments: PMH significant for asthma and COPD.  Patient reports that she was hospitalized for her asthma years ago.  No prior intubations.  She is currently on Symbicort, Albuterol inhaler, and Prednisone for her asthma.  She reports that her sister is having similar symptoms.  Patient is a 40 y.o. female presenting with cough and shortness of breath. The history is provided by the patient.  Cough This is a new problem. The current episode started 2 days ago. The problem has been gradually worsening. The cough is productive of sputum. There has been no fever. Associated symptoms include sore throat, shortness of breath and wheezing. Pertinent negatives include no chest pain, no chills, no sweats, no ear congestion, no ear pain, no headaches and no rhinorrhea. She has tried nothing for the symptoms. She is not a smoker. Her past medical history is significant for COPD. Her past medical history does not include pneumonia.  Shortness of Breath  Associated symptoms include sore throat, cough, shortness of breath and wheezing. Pertinent negatives include no chest pain, no fever and no rhinorrhea.    Past Medical History  Diagnosis Date  . COPD (chronic obstructive pulmonary disease)   . Hidradenitis suppurativa   . Asthma     PFT 6/11 FEV1 76%, 13% chagne sp BD.   Marland Kitchen Benign essential hypertension   . Morbid obesity   . Chest pain   . Obstructive sleep apnea     History reviewed. No pertinent past surgical history.  Family History  Problem Relation Age of Onset  . Alcohol abuse Sister   . Asthma Other     History  Substance Use Topics  . Smoking status: Former  Smoker -- 1.0 packs/day for 20 years    Types: Cigarettes    Quit date: 12/11/2009  . Smokeless tobacco: Never Used  . Alcohol Use: Yes     occasionally    OB History    Grav Para Term Preterm Abortions TAB SAB Ect Mult Living                  Review of Systems  Constitutional: Negative for fever, chills and diaphoresis.  HENT: Positive for sore throat. Negative for ear pain, congestion, rhinorrhea, neck pain, neck stiffness and postnasal drip.   Respiratory: Positive for cough, shortness of breath and wheezing.   Cardiovascular: Negative for chest pain.  Gastrointestinal: Positive for abdominal pain. Negative for nausea and vomiting.  Skin: Negative for rash.  Neurological: Negative for dizziness, syncope, light-headedness and headaches.    Allergies  Review of patient's allergies indicates no known allergies.  Home Medications   Current Outpatient Rx  Name Route Sig Dispense Refill  . ALBUTEROL SULFATE HFA 108 (90 BASE) MCG/ACT IN AERS Inhalation Inhale 2 puffs into the lungs every 4 (four) hours as needed. FOR SHORTNESS OF BREATH     . ALBUTEROL SULFATE (2.5 MG/3ML) 0.083% IN NEBU Nebulization Take 2.5 mg by nebulization every 6 (six) hours as needed. FOR SHORTNESS OF BREATH     . BUDESONIDE-FORMOTEROL FUMARATE 160-4.5 MCG/ACT IN AERO Inhalation Inhale 2 puffs into the lungs 2 (two) times daily. 1 Inhaler 0  . HYDROCHLOROTHIAZIDE 25 MG PO  TABS Oral Take 1 tablet (25 mg total) by mouth daily. 30 tablet 3  . OMEPRAZOLE 20 MG PO CPDR Oral Take 1 capsule (20 mg total) by mouth daily. 30 capsule 3    BP 134/90  Pulse 62  Temp(Src) 98.7 F (37.1 C) (Oral)  Resp 18  SpO2 98%  Physical Exam  Nursing note and vitals reviewed. Constitutional: She is oriented to person, place, and time. She appears well-developed and well-nourished. No distress.  HENT:  Head: Normocephalic and atraumatic.  Right Ear: Tympanic membrane normal.  Left Ear: Tympanic membrane normal.  Nose:  Mucosal edema present.  Mouth/Throat: Oropharynx is clear and moist and mucous membranes are normal.  Neck: Normal range of motion. Neck supple.  Cardiovascular: Normal rate, regular rhythm and normal heart sounds.   Pulmonary/Chest: Effort normal and breath sounds normal. No accessory muscle usage. Not tachypneic. No respiratory distress. She has no decreased breath sounds. She has no wheezes. She has no rales.  Musculoskeletal: Normal range of motion.  Lymphadenopathy:    She has no cervical adenopathy.  Neurological: She is alert and oriented to person, place, and time.  Skin: Skin is warm and dry. No rash noted. She is not diaphoretic.  Psychiatric: She has a normal mood and affect.    ED Course  Procedures (including critical care time)  Labs Reviewed - No data to display No results found.   1. Bronchitis       MDM  Productive cough.  Patient afebrile.  Not hypoxic.  No tachypnea.  Lungs CTAB.  Therefore, no CXR was ordered.  Symptoms may be caused by an Acute Bronchitis.  Due to patient's past medical history significant for COPD, will treat patient with antibiotic therapy.  Patient instructed to continue with current asthma medication and follow up with PCP.        Pascal Lux Wingen 11/13/11 0012

## 2011-11-13 NOTE — ED Provider Notes (Signed)
Medical screening examination/treatment/procedure(s) were performed by non-physician practitioner and as supervising physician I was immediately available for consultation/collaboration.  Raeford Razor, MD 11/13/11 [phone]

## 2011-11-20 ENCOUNTER — Telehealth: Payer: Self-pay | Admitting: Pulmonary Disease

## 2011-11-20 NOTE — Telephone Encounter (Signed)
I spoke with pt and states she has had a bad cough and is experiencing some SOB. I offered OV for pt today with VS but refused. Pt is scheduled to come in and see TP tomorrow at 4:00 to be evaluated. Pt aware to seek emergency care if she gets worse before then. Pt voiced her understanding

## 2011-11-21 ENCOUNTER — Encounter: Payer: Self-pay | Admitting: Adult Health

## 2011-11-21 ENCOUNTER — Ambulatory Visit (INDEPENDENT_AMBULATORY_CARE_PROVIDER_SITE_OTHER): Payer: Self-pay | Admitting: Adult Health

## 2011-11-21 VITALS — BP 136/82 | HR 75 | Temp 98.3°F | Ht 65.0 in | Wt 281.0 lb

## 2011-11-21 DIAGNOSIS — J45909 Unspecified asthma, uncomplicated: Secondary | ICD-10-CM

## 2011-11-21 DIAGNOSIS — R05 Cough: Secondary | ICD-10-CM

## 2011-11-21 MED ORDER — DOXYCYCLINE HYCLATE 100 MG PO TABS: 100.0000 mg | ORAL_TABLET | Freq: Two times a day (BID) | ORAL | Status: AC

## 2011-11-21 MED ORDER — LEVALBUTEROL HCL 0.63 MG/3ML IN NEBU
0.6300 mg | INHALATION_SOLUTION | Freq: Once | RESPIRATORY_TRACT | Status: AC
  Administered 2011-11-21: 0.63 mg via RESPIRATORY_TRACT

## 2011-11-21 MED ORDER — PREDNISONE 10 MG PO TABS: ORAL_TABLET | ORAL | Status: DC

## 2011-11-21 NOTE — Progress Notes (Signed)
Subjective:    Patient ID: Joan Brown, female    DOB: 03/27/1971, 40 y.o.   MRN: [id number]  HPI  40/F , ex -smoker, obese for FU of recurrent episodic cough/ wheezing being treated as asthma.  She  has frequent ER visits.   05/05/10--She has been evaluated by Dr Donnie Aho who feels she may have inflamed bronchial tubes. Repeated CXrs were nml as were CT angiograms on 04/02/10 & 04/15/10. She reports 'asthma' & bronchitis since childhood . She reports non productive cough without diurnal or seasonal variation.  -started on Symbicort 160/4.43mcg . Spiriva and pulmicort stopped.  PFT showed FEV1 2.20 l/m (76%) , s/p bronchodilator w 13% change.Home sleep study showed mild obstructive sleep apnea - stopped/ slowed breathing 8/h , weight loss advised   October 12, 2010 --  ER visit for dyspnea -given pred   03/28/11  ER visits x 2 this year.  Seen on 4/ for acute visit >> Begin Zyrtec10mg  daily.  Better - delsym & mucinex did not help, has not filled in hydromet C/o chest rattling, back on symbicort & is now able to get this from [facility] Denies nocturnal symptoms  05/29/11 Follow up  Returns for follow up. Says she was doing okay until last 2 weeks. Has had increased SOB, wheezing, prod cough with yellow mucus x1 month. Mucus is very thick and stringy. Has some drainage. OTC not helping. Cough worse in am . Wheezing worse last 2 days.  No chest pain or edema .  >>Augmentin and steroid taper   11/21/2011 Acute OV  Complains of prod cough with yellow mucus, wheezing, increased SOB, sweats x 2 months, worse x1week.  Taking symbicort regularly for last month, was off for a while . Got some samples. Goes to [facility] but does not always get meds. Seen in ER last week for asthma flare/URI , given rx for abx but no money to get rx.  Not working due cant find job. Trying go  to school for nursing.  No chest pain or edema.  Single mom with 3 little ones (adopting)   Review of Systems .  Constitutional:   No  weight loss, night sweats,  Fevers, chills, =fatigue, or  lassitude.  HEENT:   No headaches,  Difficulty swallowing,  Tooth/dental problems, or  Sore throat,                No sneezing, itching, ear ache, nasal congestion,  +post nasal drip,   CV:  No chest pain,  Orthopnea, PND, swelling in lower extremities, anasarca, dizziness, palpitations, syncope.   GI  No heartburn, indigestion, abdominal pain, nausea, vomiting, diarrhea, change in bowel habits, loss of appetite, bloody stools.   Resp:   No coughing up of blood.    No chest wall deformity  Skin: no rash or lesions.  GU: no dysuria, change in color of urine, no urgency or frequency.  No flank pain, no hematuria   MS:  No joint pain or swelling.  No decreased range of motion.  No back pain.  Psych:  No change in mood or affect. No depression or anxiety.  No memory loss.        Objective:   Physical Exam  GEN: A/Ox3; pleasant , NAD, obese   HEENT:  [place]/AT,  EACs-clear, TMs-wnl, NOSE-clear drainage , THROAT-clear, no lesions, no postnasal drip or exudate noted.   NECK:  Supple w/ fair ROM; no JVD; normal carotid impulses w/o bruits; no thyromegaly or nodules palpated; no lymphadenopathy.  RESP  Coarse BS w/ no wheezing no accessory muscle use, no dullness to percussion  CARD:  RRR, no m/r/g  , no peripheral edema, pulses intact, no cyanosis or clubbing.  GI:   Soft & nt; nml bowel sounds; no organomegaly or masses detected.  Musco: Warm bil, no deformities or joint swelling noted.   Neuro: alert, no focal deficits noted.    Skin: Warm, no lesions or rashes          Assessment & Plan:

## 2011-11-21 NOTE — Patient Instructions (Signed)
Doxycycline 100mg  Twice daily  Twice daily  For 7 days  Mucinex DM Twice daily  As needed   Fluids and rest  Tylenol As needed   Prednisone taper over next week.  Please contact office for sooner follow up if symptoms do not improve or worsen or seek emergency care  follow up Dr. Vassie Loll  In 6 weeks and As needed

## 2011-11-21 NOTE — Assessment & Plan Note (Signed)
Flare with URI   Plan:  Doxycycline 100mg  Twice daily  Twice daily  For 7 days  Mucinex DM Twice daily  As needed   Fluids and rest  Tylenol As needed   Prednisone taper over next week.  Please contact office for sooner follow up if symptoms do not improve or worsen or seek emergency care  follow up Dr. Vassie Loll  In 6 weeks and As needed

## 2011-12-07 ENCOUNTER — Encounter (HOSPITAL_COMMUNITY): Payer: Self-pay | Admitting: *Deleted

## 2011-12-07 ENCOUNTER — Emergency Department (HOSPITAL_COMMUNITY)
Admission: EM | Admit: 2011-12-07 | Discharge: 2011-12-08 | Disposition: A | Discharge status: Home / Self Care | Payer: Self-pay | Attending: Emergency Medicine | Admitting: Emergency Medicine

## 2011-12-07 DIAGNOSIS — J449 Chronic obstructive pulmonary disease, unspecified: Secondary | ICD-10-CM | POA: Insufficient documentation

## 2011-12-07 DIAGNOSIS — R600 Localized edema: Secondary | ICD-10-CM

## 2011-12-07 DIAGNOSIS — R609 Edema, unspecified: Secondary | ICD-10-CM | POA: Insufficient documentation

## 2011-12-07 DIAGNOSIS — J4489 Other specified chronic obstructive pulmonary disease: Secondary | ICD-10-CM | POA: Insufficient documentation

## 2011-12-07 DIAGNOSIS — I10 Essential (primary) hypertension: Secondary | ICD-10-CM | POA: Insufficient documentation

## 2011-12-07 DIAGNOSIS — M79609 Pain in unspecified limb: Secondary | ICD-10-CM | POA: Insufficient documentation

## 2011-12-07 DIAGNOSIS — Z79899 Other long term (current) drug therapy: Secondary | ICD-10-CM | POA: Insufficient documentation

## 2011-12-07 DIAGNOSIS — Z87891 Personal history of nicotine dependence: Secondary | ICD-10-CM | POA: Insufficient documentation

## 2011-12-07 LAB — POCT I-STAT, CHEM 8
BUN: 8 mg/dL (ref 6–23)
Calcium, Ion: 1.13 mmol/L (ref 1.12–1.32)
Chloride: 106 mEq/L (ref 96–112)
Glucose, Bld: 94 mg/dL (ref 70–99)

## 2011-12-07 MED ORDER — KETOROLAC TROMETHAMINE 30 MG/ML IJ SOLN
30.0000 mg | Freq: Once | INTRAMUSCULAR | Status: AC
  Administered 2011-12-07: 30 mg via INTRAMUSCULAR
  Filled 2011-12-07: qty 1

## 2011-12-07 NOTE — ED Notes (Signed)
The pt has had feet and leg pain for years.  She has just been diagnosed with arthritis and was given meds that she cannot rmemember

## 2011-12-08 MED ORDER — FUROSEMIDE 40 MG PO TABS: 40.0000 mg | ORAL_TABLET | Freq: Every day | ORAL | Status: DC

## 2011-12-08 MED ORDER — HYDROCODONE-ACETAMINOPHEN 5-325 MG PO TABS: 1.0000 | ORAL_TABLET | ORAL | Status: DC | PRN

## 2011-12-08 NOTE — ED Provider Notes (Signed)
Medical screening examination/treatment/procedure(s) were performed by non-physician practitioner and as supervising physician I was immediately available for consultation/collaboration.   Lyanne Co, MD 12/08/11 [phone]

## 2011-12-08 NOTE — ED Provider Notes (Signed)
History     CSN: [id number]  Arrival date & time 12/07/11  1851   First MD Initiated Contact with Patient 12/07/11 2317      Chief Complaint  Patient presents with  . Foot Pain     HPI  History provided by the patient. Patient is a 40 year old female with history of COPD, hypertension, morbid obesity, asthma presents with complaints of bilateral lower extremity swelling and pain. Patient reports having similar symptoms for many years states that she has had increased pain for the past one week. Denies any change in her normal medications. Patient reports symptoms do not improve with rest and elevation of legs. Pain is worse with standing and walking all day. Patient reports particular pains from the knee to the ankles and around her heels. Patient has taken over-the-counter pain medications without significant relief. Patient denies any increase shortness of breath, chest pain, hemoptysis, lightheadedness or near syncope. She has no other significant past medical history.    Past Medical History  Diagnosis Date  . COPD (chronic obstructive pulmonary disease)   . Hidradenitis suppurativa   . Asthma     PFT 6/11 FEV1 76%, 13% chagne sp BD.   Marland Kitchen Benign essential hypertension   . Morbid obesity   . Chest pain   . Obstructive sleep apnea     History reviewed. No pertinent past surgical history.  Family History  Problem Relation Age of Onset  . Alcohol abuse Sister   . Asthma Other     History  Substance Use Topics  . Smoking status: Former Smoker -- 1.0 packs/day for 20 years    Types: Cigarettes    Quit date: 12/11/2009  . Smokeless tobacco: Never Used  . Alcohol Use: Yes     occasionally    OB History    Grav Para Term Preterm Abortions TAB SAB Ect Mult Living                  Review of Systems  Constitutional: Negative for chills and fatigue.  Respiratory: Negative for cough and shortness of breath.   Cardiovascular: Negative for chest pain.    Gastrointestinal: Negative for abdominal pain.  All other systems reviewed and are negative.    Allergies  Review of patient's allergies indicates no known allergies.  Home Medications   Current Outpatient Rx  Name Route Sig Dispense Refill  . ALBUTEROL SULFATE HFA 108 (90 BASE) MCG/ACT IN AERS Inhalation Inhale 2 puffs into the lungs every 4 (four) hours as needed. FOR SHORTNESS OF BREATH     . ALBUTEROL SULFATE (2.5 MG/3ML) 0.083% IN NEBU Nebulization Take 2.5 mg by nebulization every 6 (six) hours as needed. FOR SHORTNESS OF BREATH     . BUDESONIDE-FORMOTEROL FUMARATE 160-4.5 MCG/ACT IN AERO Inhalation Inhale 2 puffs into the lungs 2 (two) times daily. 1 Inhaler 0  . HYDROCOD POLST-CHLORPHEN POLST 10-8 MG/5ML PO LQCR Oral Take 5 mLs by mouth every 12 (twelve) hours as needed. For cough     . HYDROCHLOROTHIAZIDE 25 MG PO TABS Oral Take 1 tablet (25 mg total) by mouth daily. 30 tablet 3  . OMEPRAZOLE 20 MG PO CPDR Oral Take 1 capsule (20 mg total) by mouth daily. 30 capsule 3    BP 148/83  Pulse 76  Temp(Src) 98.2 F (36.8 C) (Oral)  Resp 21  SpO2 96%  Physical Exam  Nursing note and vitals reviewed. Constitutional: She is oriented to person, place, and time. She appears well-developed and well-nourished. No  distress.  HENT:  Head: Normocephalic.  Cardiovascular: Normal rate and regular rhythm.   Pulmonary/Chest: Effort normal and breath sounds normal. No respiratory distress. She has no wheezes. She has no rales.  Abdominal: Soft.  Musculoskeletal:       Mild swelling to bilateral lower extremities around ankles and lower legs. Edema is nonpitting. Patient has normal sensations, dorsal pedal pulses, cap refill. Patient has normal strength in lower extremities.  Neurological: She is alert and oriented to person, place, and time. Gait normal.  Skin: Skin is warm and dry. No rash noted.  Psychiatric: She has a normal mood and affect. Her behavior is normal.    ED Course   Procedures (including critical care time)   Labs Reviewed  POCT I-STAT, CHEM 8  I-STAT, CHEM 8   Results for orders placed during the hospital encounter of 12/07/11  POCT I-STAT, CHEM 8      Component Value Range   Sodium 142  135 - 145 (mEq/L)   Potassium 3.6  3.5 - 5.1 (mEq/L)   Chloride 106  96 - 112 (mEq/L)   BUN 8  6 - 23 (mg/dL)   Creatinine, Ser 1.61  0.50 - 1.10 (mg/dL)   Glucose, Bld 94  70 - 99 (mg/dL)   Calcium, Ion 0.96  0.45 - 1.32 (mmol/L)   TCO2 27  0 - 100 (mmol/L)   Hemoglobin 12.9  12.0 - 15.0 (g/dL)   HCT 40.9  81.1 - 91.4 (%)     1. Bilateral leg edema       MDM  11:35 PM patient seen and evaluated. Patient in no acute distress.        Angus Seller, PA 12/08/11 0330

## 2011-12-08 NOTE — ED Notes (Signed)
Rx x 2, pt voiced understanding to f/u with PCP 

## 2011-12-13 ENCOUNTER — Ambulatory Visit (INDEPENDENT_AMBULATORY_CARE_PROVIDER_SITE_OTHER): Payer: Self-pay | Admitting: Internal Medicine

## 2011-12-13 ENCOUNTER — Ambulatory Visit (HOSPITAL_COMMUNITY)
Admission: RE | Admit: 2011-12-13 | Discharge: 2011-12-13 | Disposition: A | Discharge status: Home / Self Care | Payer: Self-pay | Source: Ambulatory Visit | Attending: Internal Medicine | Admitting: Internal Medicine

## 2011-12-13 DIAGNOSIS — M7989 Other specified soft tissue disorders: Secondary | ICD-10-CM | POA: Insufficient documentation

## 2011-12-13 DIAGNOSIS — M79643 Pain in unspecified hand: Secondary | ICD-10-CM

## 2011-12-13 DIAGNOSIS — M79609 Pain in unspecified limb: Secondary | ICD-10-CM

## 2011-12-13 DIAGNOSIS — R05 Cough: Secondary | ICD-10-CM

## 2011-12-13 LAB — POCT URINE PREGNANCY: Preg Test, Ur: NEGATIVE

## 2011-12-13 MED ORDER — MELOXICAM 15 MG PO TABS: 15.0000 mg | ORAL_TABLET | Freq: Every day | ORAL | Status: DC

## 2011-12-13 NOTE — Assessment & Plan Note (Signed)
In combination with her leg swelling concern for inflammatory disease. Will obtain ANA, rheumatoid factor, ESR.

## 2011-12-13 NOTE — Progress Notes (Signed)
Bilateral lower extremity venous duplex completed.  Preliminary report is negative for DVT, SVT, or a Baker's cyst in the right lower extremity.  Unable to rule out DVT in the left lower extremity. Smiley Houseman 12/13/2011, 4:32 PM

## 2011-12-13 NOTE — Progress Notes (Signed)
Subjective:   Patient ID: Joan Brown female   DOB: 05/04/1971 41 y.o.   MRN: [id number]  HPI: [name] is a 41 y.o. female with past medical history is significant as outlined below who presented to the clinic with a bilateral lower extremity swelling. She reports that the swelling has been present for some time but since last week it has been worsening. Furthermore she noted severe pain starting out from the knee down to her foot. She describes it as a throbbing pressure like pain which is present most of the time. She was evaluated in the ED on December 28 and was prescribed Lasix and Norco. She did not fill Lasix but has been taking Norco with no significant improvement in her pain. She has received Toradol in the ED which gave her for a couple of hours relief. She tried nonsteroidal over-the-counter which again did not show any improvement She is trying to elevate her legs as much as possible but during the night she has trouble to lie flat she has been experiencing shortness of breath and noted that she was waking up in the night gasping for breath. She has been evaluated in the pons for sleep apnea and was noted to have mild to moderate disease with no need for CPAP.    Past Medical History  Diagnosis Date  . COPD (chronic obstructive pulmonary disease)   . Hidradenitis suppurativa   . Asthma     PFT 6/11 FEV1 76%, 13% chagne sp BD.   Marland Kitchen Benign essential hypertension   . Morbid obesity   . Chest pain   . Obstructive sleep apnea    Current Outpatient Prescriptions  Medication Sig Dispense Refill  . albuterol (PROVENTIL HFA;VENTOLIN HFA) 108 (90 BASE) MCG/ACT inhaler Inhale 2 puffs into the lungs every 4 (four) hours as needed. FOR SHORTNESS OF BREATH       . albuterol (PROVENTIL) (2.5 MG/3ML) 0.083% nebulizer solution Take 2.5 mg by nebulization every 6 (six) hours as needed. FOR SHORTNESS OF BREATH       . budesonide-formoterol (SYMBICORT) 160-4.5 MCG/ACT inhaler Inhale 2 puffs  into the lungs 2 (two) times daily.  1 Inhaler  0  . chlorpheniramine-HYDROcodone (TUSSIONEX) 10-8 MG/5ML LQCR Take 5 mLs by mouth every 12 (twelve) hours as needed. For cough       . furosemide (LASIX) 40 MG tablet Take 1 tablet (40 mg total) by mouth daily.  10 tablet  0  . hydrochlorothiazide (HYDRODIURIL) 25 MG tablet Take 1 tablet (25 mg total) by mouth daily.  30 tablet  3  . HYDROcodone-acetaminophen (NORCO) 5-325 MG per tablet Take 1 tablet by mouth every 4 (four) hours as needed for pain.  10 tablet  0  . omeprazole (PRILOSEC) 20 MG capsule Take 1 capsule (20 mg total) by mouth daily.  30 capsule  3   Review of Systems: Constitutional: Denies fever, chills, diaphoresis, appetite change and fatigue.   Respiratory: Denies SOB, DOE, cough, chest tightness,  and wheezing.   Cardiovascular: Denies chest pain, palpitations and leg swelling.  Gastrointestinal: Denies nausea, vomiting, abdominal pain, diarrhea, constipation,   Skin: Denies pallor, rash and wound.  Neurological: Denies dizziness, seizures, syncope, weakness, light-headedness, numbness and headaches.    Objective:  Physical Exam: Filed Vitals:   12/13/11 1410  Temp: 98 F (36.7 C)  TempSrc: Oral  Height: 5\' 5"  (1.651 m)  Weight: 285 lb (129.275 kg)  SpO2: 98%   Constitutional: Vital signs reviewed.  Patient is a obese  female   in no acute distress and cooperative with exam. Alert and oriented x3.  Neck: Supple,  Cardiovascular: RRR, S1 normal, S2 normal, no MRG, pulses symmetric and intact bilaterally Pulmonary/Chest: CTAB, no wheezes, rales, or rhonchi Abdominal: Soft. Non-tender, non-distended, bowel sounds are normal,  Musculoskeletal: Knee swelling noted. Both lower extremities significant swelling with trace edema notable bilaterally. No erythema or lesions noted. Severely tender on palpation from the knee down to her feet  Neurological: A&O x3,  no focal motor deficit, sensory intact to light touch bilaterally.    Skin: Warm, dry and intact. No rash, cyanosis, or clubbing.

## 2011-12-13 NOTE — Assessment & Plan Note (Signed)
Patient has been experiencing chronic cough since 1 year. The cough is nonproductive. Patient was noted to have shortness of breath when lying flat. Patient is a nonsmoker. We will obtain chest x-ray. If negative may need repeat 2-D echo. An echo was performed in May of 2011 which was within normal limits.

## 2011-12-13 NOTE — Assessment & Plan Note (Signed)
Concern for DVT although highly unlikely since bilateral. Nevertheless the leg Dopplers were performed which was negative. Considering the time frame and there is  pain severity concern for inflammatory disease. Therefore will obtain ANA, rheumatoid factor, ESR, CK total is possible myositis, TSH. Since narcotic has not improved her pain would discontinue Norco which was prescribed by the ED. Prescribe Mobic 15 mg once a day and reevaluate her in one week for further management. It is at this point we are unclear what is causing her significant leg swelling and tenderness.

## 2011-12-14 LAB — CBC WITH DIFFERENTIAL/PLATELET
Basophils Relative: 0 % (ref 0–1)
Eosinophils Absolute: 0.3 10*3/uL (ref 0.0–0.7)
HCT: 38.7 % (ref 36.0–46.0)
Hemoglobin: 12.2 g/dL (ref 12.0–15.0)
MCH: 27.5 pg (ref 26.0–34.0)
MCHC: 31.5 g/dL (ref 30.0–36.0)
Monocytes Absolute: 0.5 10*3/uL (ref 0.1–1.0)
Monocytes Relative: 7 % (ref 3–12)
Neutro Abs: 3.3 10*3/uL (ref 1.7–7.7)

## 2011-12-14 LAB — COMPREHENSIVE METABOLIC PANEL
Albumin: 3.9 g/dL (ref 3.5–5.2)
Alkaline Phosphatase: 57 U/L (ref 39–117)
BUN: 10 mg/dL (ref 6–23)
CO2: 26 mEq/L (ref 19–32)
Glucose, Bld: 91 mg/dL (ref 70–99)
Potassium: 4.1 mEq/L (ref 3.5–5.3)

## 2011-12-14 LAB — CK: Total CK: 272 U/L — ABNORMAL HIGH (ref 7–177)

## 2011-12-14 LAB — ANA: Anti Nuclear Antibody(ANA): NEGATIVE

## 2011-12-15 LAB — PRESCRIPTION ABUSE MONITORING 15P, URINE
Benzodiazepine Screen, Urine: NEGATIVE NG/ML
Buprenorphine, Urine: NEGATIVE NG/ML
Cannabinoid Scrn, Ur: NEGATIVE NG/ML
Meperidine, Ur: NEGATIVE NG/ML
Methadone Screen, Urine: NEGATIVE NG/ML
Propoxyphene: NEGATIVE NG/ML
Tramadol Scrn, Ur: NEGATIVE NG/ML

## 2012-01-09 ENCOUNTER — Ambulatory Visit (HOSPITAL_COMMUNITY)
Admission: RE | Admit: 2012-01-09 | Discharge: 2012-01-09 | Disposition: A | Discharge status: Home / Self Care | Payer: Self-pay | Source: Ambulatory Visit | Attending: Internal Medicine | Admitting: Internal Medicine

## 2012-01-09 DIAGNOSIS — R05 Cough: Secondary | ICD-10-CM | POA: Insufficient documentation

## 2012-01-09 DIAGNOSIS — R059 Cough, unspecified: Secondary | ICD-10-CM | POA: Insufficient documentation

## 2012-01-15 ENCOUNTER — Ambulatory Visit (INDEPENDENT_AMBULATORY_CARE_PROVIDER_SITE_OTHER): Payer: Self-pay | Admitting: Internal Medicine

## 2012-01-15 ENCOUNTER — Encounter: Payer: Self-pay | Admitting: Internal Medicine

## 2012-01-15 DIAGNOSIS — I10 Essential (primary) hypertension: Secondary | ICD-10-CM

## 2012-01-15 DIAGNOSIS — M7989 Other specified soft tissue disorders: Secondary | ICD-10-CM

## 2012-01-15 DIAGNOSIS — J45909 Unspecified asthma, uncomplicated: Secondary | ICD-10-CM

## 2012-01-15 MED ORDER — HYDROCHLOROTHIAZIDE 25 MG PO TABS: 25.0000 mg | ORAL_TABLET | Freq: Every day | ORAL | Status: DC

## 2012-01-15 MED ORDER — OMEPRAZOLE 20 MG PO CPDR: 20.0000 mg | DELAYED_RELEASE_CAPSULE | Freq: Every day | ORAL | Status: DC

## 2012-01-15 MED ORDER — ALBUTEROL SULFATE HFA 108 (90 BASE) MCG/ACT IN AERS: 2.0000 | INHALATION_SPRAY | RESPIRATORY_TRACT | Status: DC | PRN

## 2012-01-15 MED ORDER — BUDESONIDE-FORMOTEROL FUMARATE 160-4.5 MCG/ACT IN AERO: 2.0000 | INHALATION_SPRAY | Freq: Two times a day (BID) | RESPIRATORY_TRACT | Status: DC

## 2012-01-15 MED ORDER — MELOXICAM 15 MG PO TABS: 15.0000 mg | ORAL_TABLET | Freq: Every day | ORAL | Status: DC

## 2012-01-15 NOTE — Assessment & Plan Note (Addendum)
Joan Brown continues to complain of bilateral leg swelling along with hand swelling. On my exam I could not appreciate any leg swelling but Joan Brown may have mild edema over her hands, it was also  difficult to appreciate and discern any changes, given her morbid obesity. But Joan Brown did not have any involvement of MCP,  PIP or DIP. Joan Brown also had extensive workup during her last visit which was negative and any inflammatory process. I don not think that Joan Brown fulfills the criteria for fibromyalgia as well . Her echo from 2011 and was reviewed which did not show any right heart involvement.  Plan: -The patient was advised to lose some weight and continue an attempt to diet and exercise that would definitely help her.

## 2012-01-15 NOTE — Progress Notes (Signed)
  Subjective:    Patient ID: Joan Brown, female    DOB: 1971-05-26, 41 y.o.   MRN: [id number]  HPI: 41 year old woman with past medical history significant for morbid obesity, asthma comes to the  clinic for bilateral hand and feet swelling for few months.  Patient reports that she has been experiencing bilateral hand and feet swelling for last few months associated with numbness. Patient was also seen in our clinic about a month ago for a similar complaint when she had an extensive negative work up. Patient was given some Mobic for some symptomatic treatment and she is requesting some refill on that.  She has been trying  to exercise and diet but that's not helping her. She states that whenever she tries to bend her knees, she hears a pop that makes it difficult.   States that she is not depressed but has been going through a lot of stress lately. She is currently homeless and is trying to live in shelters.    Review of Systems  Constitutional: Negative for fever and fatigue.  HENT: Negative for rhinorrhea, sneezing and postnasal drip.   Eyes: Negative for photophobia and visual disturbance.  Respiratory: Negative for apnea and chest tightness.   Cardiovascular: Negative for chest pain, palpitations and leg swelling.  Gastrointestinal: Negative for abdominal pain and constipation.  Genitourinary: Negative for dysuria and flank pain.  Musculoskeletal: Positive for arthralgias.  Neurological: Positive for numbness. Negative for dizziness, facial asymmetry, light-headedness and headaches.  Hematological: Negative for adenopathy.       Objective:   Physical Exam  Constitutional: She is oriented to person, place, and time. She appears well-developed and well-nourished.  HENT:  Head: Normocephalic and atraumatic.  Mouth/Throat: No oropharyngeal exudate.  Eyes: Conjunctivae and EOM are normal. Pupils are equal, round, and reactive to light.  Neck: Normal range of motion. Neck supple. No  JVD present. No tracheal deviation present. No thyromegaly present.  Cardiovascular: Normal rate, regular rhythm and intact distal pulses.  Exam reveals no gallop and no friction rub.   No murmur heard. Pulmonary/Chest: Effort normal and breath sounds normal. No stridor. No respiratory distress. She has no wheezes. She has no rales. She exhibits no tenderness.  Abdominal: Soft. Bowel sounds are normal. She exhibits no distension and no mass. There is no tenderness. There is no rebound and no guarding.  Musculoskeletal: Normal range of motion. She exhibits no edema and no tenderness.  Lymphadenopathy:    She has no cervical adenopathy.  Neurological: She is alert and oriented to person, place, and time. She has normal reflexes. She displays normal reflexes. No cranial nerve deficit. Coordination normal.  Skin: Skin is warm.          Assessment & Plan:

## 2012-01-15 NOTE — Patient Instructions (Addendum)
Please schedule a follow up appointment with your PCP in 1-2 months. Please bring your medication bottles with your next appointment. PLEASE TRY TO REDUCE SOME WEIGHT AND DO SOME EXERCISES!

## 2012-02-16 ENCOUNTER — Encounter (HOSPITAL_COMMUNITY): Payer: Self-pay | Admitting: Emergency Medicine

## 2012-02-16 ENCOUNTER — Emergency Department (HOSPITAL_COMMUNITY): Payer: Self-pay

## 2012-02-16 ENCOUNTER — Emergency Department (HOSPITAL_COMMUNITY)
Admission: EM | Admit: 2012-02-16 | Discharge: 2012-02-16 | Disposition: A | Discharge status: Home / Self Care | Payer: Self-pay | Attending: Emergency Medicine | Admitting: Emergency Medicine

## 2012-02-16 DIAGNOSIS — W19XXXA Unspecified fall, initial encounter: Secondary | ICD-10-CM

## 2012-02-16 DIAGNOSIS — J4489 Other specified chronic obstructive pulmonary disease: Secondary | ICD-10-CM | POA: Insufficient documentation

## 2012-02-16 DIAGNOSIS — Z87891 Personal history of nicotine dependence: Secondary | ICD-10-CM | POA: Insufficient documentation

## 2012-02-16 DIAGNOSIS — M533 Sacrococcygeal disorders, not elsewhere classified: Secondary | ICD-10-CM | POA: Insufficient documentation

## 2012-02-16 DIAGNOSIS — I10 Essential (primary) hypertension: Secondary | ICD-10-CM | POA: Insufficient documentation

## 2012-02-16 DIAGNOSIS — G4733 Obstructive sleep apnea (adult) (pediatric): Secondary | ICD-10-CM | POA: Insufficient documentation

## 2012-02-16 DIAGNOSIS — J449 Chronic obstructive pulmonary disease, unspecified: Secondary | ICD-10-CM | POA: Insufficient documentation

## 2012-02-16 DIAGNOSIS — L732 Hidradenitis suppurativa: Secondary | ICD-10-CM | POA: Insufficient documentation

## 2012-02-16 DIAGNOSIS — W108XXA Fall (on) (from) other stairs and steps, initial encounter: Secondary | ICD-10-CM | POA: Insufficient documentation

## 2012-02-16 MED ORDER — OXYCODONE-ACETAMINOPHEN 5-325 MG PO TABS: 1.0000 | ORAL_TABLET | Freq: Four times a day (QID) | ORAL | Status: AC | PRN

## 2012-02-16 MED ORDER — OXYCODONE-ACETAMINOPHEN 5-325 MG PO TABS
2.0000 | ORAL_TABLET | Freq: Once | ORAL | Status: AC
  Administered 2012-02-16: 2 via ORAL
  Filled 2012-02-16: qty 2

## 2012-02-16 NOTE — ED Provider Notes (Signed)
History     CSN: [id number]  Arrival date & time 02/16/12  4098   First MD Initiated Contact with Patient 02/16/12 [phone]      Chief Complaint  Patient presents with  . Fall    (Consider location/radiation/quality/duration/timing/severity/associated sxs/prior treatment) HPI Comments: Patient reports that just prior to arrival she slipped on the stairs and fell down a couple of carpeted stairs.  She reports that she landed on her "tailbone"  She was ambulatory after the fall.  She did not hit her head.  No LOC.  She is now having pain in her sacral/coccyx area.  She reports she has not taken anything for pain.  Patient is a 41 y.o. female presenting with fall. The history is provided by the patient.  Fall The accident occurred 1 to 2 hours ago. She landed on carpet. Pertinent negatives include no visual change, no fever, no numbness, no abdominal pain, no bowel incontinence, no nausea, no vomiting, no headaches, no loss of consciousness and no tingling. The symptoms are aggravated by ambulation and sitting. She has tried nothing for the symptoms.    Past Medical History  Diagnosis Date  . COPD (chronic obstructive pulmonary disease)   . Hidradenitis suppurativa   . Asthma     PFT 6/11 FEV1 76%, 13% chagne sp BD.   Marland Kitchen Benign essential hypertension   . Morbid obesity   . Chest pain   . Obstructive sleep apnea     History reviewed. No pertinent past surgical history.  Family History  Problem Relation Age of Onset  . Alcohol abuse Sister   . Asthma Other     History  Substance Use Topics  . Smoking status: Former Smoker -- 1.0 packs/day for 20 years    Types: Cigarettes    Quit date: 12/11/2009  . Smokeless tobacco: Never Used  . Alcohol Use: Yes     occasionally    OB History    Grav Para Term Preterm Abortions TAB SAB Ect Mult Living                  Review of Systems  Constitutional: Negative for fever and chills.  HENT: Negative for neck pain and neck stiffness.    Respiratory: Negative for shortness of breath.   Cardiovascular: Negative for chest pain.  Gastrointestinal: Negative for nausea, vomiting, abdominal pain and bowel incontinence.  Musculoskeletal: Positive for back pain. Negative for joint swelling.  Skin: Negative for color change and wound.  Neurological: Negative for dizziness, tingling, loss of consciousness, syncope, light-headedness, numbness and headaches.  Psychiatric/Behavioral: Negative for confusion.    Allergies  Aspirin  Home Medications   Current Outpatient Rx  Name Route Sig Dispense Refill  . ALBUTEROL SULFATE HFA 108 (90 BASE) MCG/ACT IN AERS Inhalation Inhale 2 puffs into the lungs every 4 (four) hours as needed. FOR SHORTNESS OF BREATH 1 Inhaler 3  . ALBUTEROL SULFATE (2.5 MG/3ML) 0.083% IN NEBU Nebulization Take 2.5 mg by nebulization every 6 (six) hours as needed. FOR SHORTNESS OF BREATH     . BUDESONIDE-FORMOTEROL FUMARATE 160-4.5 MCG/ACT IN AERO Inhalation Inhale 2 puffs into the lungs 2 (two) times daily. 1 Inhaler 3  . HYDROCHLOROTHIAZIDE 25 MG PO TABS Oral Take 1 tablet (25 mg total) by mouth daily. 30 tablet 3  . MELOXICAM 15 MG PO TABS Oral Take 1 tablet (15 mg total) by mouth daily. 14 tablet 0  . OMEPRAZOLE 20 MG PO CPDR Oral Take 1 capsule (20 mg total) by  mouth daily. 30 capsule 3    BP 111/73  Pulse 90  Temp(Src) 98.3 F (36.8 C) (Oral)  Resp 22  SpO2 98%  Physical Exam  Nursing note and vitals reviewed. Constitutional: She is oriented to person, place, and time. She appears well-developed and well-nourished. No distress.  HENT:  Head: Normocephalic and atraumatic.  Neck: Normal range of motion. Neck supple.  Cardiovascular: Normal rate, regular rhythm and normal heart sounds.   Pulmonary/Chest: Effort normal and breath sounds normal. No respiratory distress. She has no wheezes. She exhibits no tenderness.  Musculoskeletal: Normal range of motion.       Lumbar back: She exhibits normal range  of motion, no bony tenderness, no swelling and no deformity.       Tenderness to palpation over the coccyx and sacral area.  Neurological: She is alert and oriented to person, place, and time. No sensory deficit.  Skin: Skin is warm, dry and intact. No abrasion, no bruising, no ecchymosis and no laceration noted. She is not diaphoretic. No erythema.  Psychiatric: She has a normal mood and affect.    ED Course  Procedures (including critical care time)  Labs Reviewed - No data to display No results found.   No diagnosis found.    MDM  Patient presents with sacral/coccyx pain after a fall down several carpeted stairs.  Patient with negative xrays.  Patient ambulatory.  Neurovascularly intact.  Therefore, felt that patient could be discharged home and encouraged to follow up with PCP.        Pascal Lux Addy, PA-C 02/16/12 1755

## 2012-02-16 NOTE — ED Notes (Signed)
Pt c/o falling down stairs onto buttocks today; pt c/o pain in tailbone area; pt denies other injury

## 2012-02-16 NOTE — ED Notes (Signed)
Patient presents with sacral pain after falling buttocks first down 8 steps. Patient denies LOC or hitting head. Patient ambulatory in department, pillows placed under buttocks for comfort.  No obvious deformities.

## 2012-02-17 NOTE — ED Provider Notes (Signed)
Medical screening examination/treatment/procedure(s) were performed by non-physician practitioner and as supervising physician I was immediately available for consultation/collaboration.   Shakia Sebastiano, MD 02/17/12 1548 

## 2012-04-11 ENCOUNTER — Encounter: Payer: Self-pay | Admitting: *Deleted

## 2012-04-11 ENCOUNTER — Ambulatory Visit (INDEPENDENT_AMBULATORY_CARE_PROVIDER_SITE_OTHER): Payer: Self-pay | Admitting: Ophthalmology

## 2012-04-11 ENCOUNTER — Encounter: Payer: Self-pay | Admitting: Ophthalmology

## 2012-04-11 VITALS — BP 120/79 | HR 94 | Temp 97.4°F | Ht 65.0 in | Wt 282.4 lb

## 2012-04-11 DIAGNOSIS — M1712 Unilateral primary osteoarthritis, left knee: Secondary | ICD-10-CM

## 2012-04-11 DIAGNOSIS — Z79899 Other long term (current) drug therapy: Secondary | ICD-10-CM

## 2012-04-11 DIAGNOSIS — I10 Essential (primary) hypertension: Secondary | ICD-10-CM

## 2012-04-11 DIAGNOSIS — J45909 Unspecified asthma, uncomplicated: Secondary | ICD-10-CM

## 2012-04-11 DIAGNOSIS — E669 Obesity, unspecified: Secondary | ICD-10-CM

## 2012-04-11 DIAGNOSIS — M25569 Pain in unspecified knee: Secondary | ICD-10-CM

## 2012-04-11 LAB — GLUCOSE, CAPILLARY: Glucose-Capillary: 95 mg/dL (ref 70–99)

## 2012-04-11 LAB — POCT GLYCOSYLATED HEMOGLOBIN (HGB A1C): Hemoglobin A1C: 5.8

## 2012-04-11 MED ORDER — ALBUTEROL SULFATE (2.5 MG/3ML) 0.083% IN NEBU: 2.5000 mg | INHALATION_SOLUTION | Freq: Four times a day (QID) | RESPIRATORY_TRACT | Status: DC | PRN

## 2012-04-11 MED ORDER — BUDESONIDE-FORMOTEROL FUMARATE 160-4.5 MCG/ACT IN AERO: 2.0000 | INHALATION_SPRAY | Freq: Two times a day (BID) | RESPIRATORY_TRACT | Status: DC

## 2012-04-11 MED ORDER — OMEPRAZOLE 20 MG PO CPDR: 20.0000 mg | DELAYED_RELEASE_CAPSULE | Freq: Every day | ORAL | Status: DC

## 2012-04-11 MED ORDER — NAPROXEN 500 MG PO TABS: 500.0000 mg | ORAL_TABLET | Freq: Two times a day (BID) | ORAL | Status: DC

## 2012-04-11 MED ORDER — ALBUTEROL SULFATE HFA 108 (90 BASE) MCG/ACT IN AERS: 2.0000 | INHALATION_SPRAY | RESPIRATORY_TRACT | Status: DC | PRN

## 2012-04-11 MED ORDER — HYDROCHLOROTHIAZIDE 25 MG PO TABS: 25.0000 mg | ORAL_TABLET | Freq: Every day | ORAL | Status: DC

## 2012-04-11 NOTE — Assessment & Plan Note (Signed)
Patient has continued knee pain especially in left knee. She was seen in sports medicine in august of 2012 and was supposed to be followed up in one month. 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 They mentioned that patient could benefit from bracing for instability. Will re-refer her to sports medicine. Stressed to her that weight loss will also help.

## 2012-04-11 NOTE — Assessment & Plan Note (Signed)
Well controlled on HCTZ. No changes at this time.

## 2012-04-11 NOTE — Patient Instructions (Signed)
Please follow up with sports medicine for bracing or possible injections.

## 2012-04-11 NOTE — Progress Notes (Signed)
Subjective:   Patient ID: Joan Brown female   DOB: [birth year] 41 y.o.   MRN: [id number]  HPI: [name] is a 41 y.o. woman who presents for follow up of knee pain.  She describes that she has pain in both her knees that is worst first thing in the morning. She has significant difficulty walking up and down stairs. She has to physically lift leg to get up stairs and it takes her 5-10 minutes to get up the steps in her moms house. She reports that the swelling is not present currently and has improved though it still occurs intermittently.  Asthma- nebulizer at least twice a day and inhaler at least TID. Using nebulizer at night. Symbicort twice a day. She feels like she is congested. Can walk to the front of the clinic without being SOB.  Obesity- has been taking green coffee extract. Has tried to bike or skate but she feels like her knees are stuck. Has excruciating pain with trying to exercise. She is willing to meet with Joan Brown though she says that she has a certificate in nutrition. Says she eats small meals 3-4 every day. Avoids fried foods.   Also describes having her fingers and toes hyper extend and get stuck.  Past Medical History  Diagnosis Date  . COPD (chronic obstructive pulmonary disease)   . Hidradenitis suppurativa   . Asthma     PFT 6/11 FEV1 76%, 13% chagne sp BD.   Marland Kitchen Benign essential hypertension   . Morbid obesity   . Chest pain   . Obstructive sleep apnea    Current Outpatient Prescriptions  Medication Sig Dispense Refill  . albuterol (PROVENTIL HFA;VENTOLIN HFA) 108 (90 BASE) MCG/ACT inhaler Inhale 2 puffs into the lungs every 4 (four) hours as needed. FOR SHORTNESS OF BREATH  1 Inhaler  3  . albuterol (PROVENTIL) (2.5 MG/3ML) 0.083% nebulizer solution Take 2.5 mg by nebulization every 6 (six) hours as needed. FOR SHORTNESS OF BREATH       . budesonide-formoterol (SYMBICORT) 160-4.5 MCG/ACT inhaler Inhale 2 puffs into the lungs 2 (two) times daily.  1 Inhaler   3  . hydrochlorothiazide (HYDRODIURIL) 25 MG tablet Take 1 tablet (25 mg total) by mouth daily.  30 tablet  3  . naproxen (NAPROSYN) 500 MG tablet Take 500 mg by mouth 2 (two) times daily with a meal.      . omeprazole (PRILOSEC) 20 MG capsule Take 1 capsule (20 mg total) by mouth daily.  30 capsule  3   Family History  Problem Relation Age of Onset  . Alcohol abuse Sister   . Asthma Other    History   Social History  . Marital Status: Single    Spouse Name: N/A    Number of Children: 1  . Years of Education: N/A   Occupational History  . hair stylist    Social History Main Topics  . Smoking status: Former Smoker -- 1.0 packs/day for 20 years    Types: Cigarettes    Quit date: 12/11/2009  . Smokeless tobacco: Never Used  . Alcohol Use: Yes     occasionally  . Drug Use: No  . Sexually Active: None   Other Topics Concern  . None   Social History Narrative   Financial assistance approved for 100% discount at [facility] and has [facility] card per Joan [place]12/01/2010 patient is single with no children she is a hairstylist     Objective:  Physical Exam: Filed Vitals:   04/11/12  1116  BP: 120/79  Pulse: 94  Temp: 97.4 F (36.3 C)  TempSrc: Oral  Height: 5\' 5"  (1.651 m)  Weight: 282 lb 6.4 oz (128.096 kg)  SpO2: 98%   General: sitting in chair HEENT: PERRL, EOMI, no scleral icterus Cardiac: RRR, no rubs, murmurs or gallops Pulm: clear to auscultation bilaterally, moving normal volumes of air Abd: soft, nontender, nondistended, BS present Ext: warm and well perfused, no pedal edema Neuro: alert and oriented X3, cranial nerves II-XII grossly intact  Assessment & Plan:

## 2012-04-11 NOTE — Assessment & Plan Note (Signed)
Patient expresses inability to exercise. She has agreed to referral to [facility] for nutritional counseling.

## 2012-04-11 NOTE — Progress Notes (Unsigned)
Pt walked into clinic with c/o pain to both knees.  Onset one week ago, no known cause. This is not new, was treated in past  - 3 months ago.  Pain is constant, hurts to sit and lay.  Has tried Vicodin, naprosyn with some relief.  She also gets bruising to legs.

## 2012-04-19 ENCOUNTER — Encounter (HOSPITAL_COMMUNITY): Payer: Self-pay | Admitting: *Deleted

## 2012-04-19 ENCOUNTER — Emergency Department (HOSPITAL_COMMUNITY)
Admission: EM | Admit: 2012-04-19 | Discharge: 2012-04-19 | Disposition: A | Discharge status: Home / Self Care | Payer: Self-pay | Attending: Emergency Medicine | Admitting: Emergency Medicine

## 2012-04-19 DIAGNOSIS — J4489 Other specified chronic obstructive pulmonary disease: Secondary | ICD-10-CM | POA: Insufficient documentation

## 2012-04-19 DIAGNOSIS — J449 Chronic obstructive pulmonary disease, unspecified: Secondary | ICD-10-CM | POA: Insufficient documentation

## 2012-04-19 DIAGNOSIS — I10 Essential (primary) hypertension: Secondary | ICD-10-CM | POA: Insufficient documentation

## 2012-04-19 MED ORDER — PREDNISONE 20 MG PO TABS
60.0000 mg | ORAL_TABLET | Freq: Once | ORAL | Status: AC
  Administered 2012-04-19: 60 mg via ORAL
  Filled 2012-04-19: qty 3

## 2012-04-19 MED ORDER — PREDNISONE 50 MG PO TABS: ORAL_TABLET | ORAL | Status: AC

## 2012-04-19 NOTE — Discharge Instructions (Signed)
Take prednisone as directed but follow up with your pulmonologist and or Out Patient Clinics in the near future is VERY important to further discuss your COPD and asthma management. Return to [facility] ER at any time for changing or worsening of symptoms.   Chronic Obstructive Pulmonary Disease Chronic obstructive pulmonary disease (COPD) is a lung disease. The lungs become damaged, making it hard to get air in and out of your lungs. The damage to your lungs cannot be changed.  HOME CARE  Stop smoking if you smoke. Avoid secondhand smoke.   Only take medicine as told by your doctor.   Talk to your doctor about using cough syrup or over-the-counter medicines.   Drink enough fluids to keep your pee (urine) clear or pale yellow.   Use a humidifier or vaporizer. This may help loosen the thick spit (mucus).   Talk to your doctor about vaccines that help prevent other lung problems (pneumonia and flu vaccines).   Use home oxygen as told by your doctor.   Stay active and exercise.   Eat healthy foods.  GET HELP RIGHT AWAY IF:   Your heart is beating fast.   You become disturbed, confused, shake, or are dazed.   You have trouble breathing.   You have chest pain.   You have a fever.   You cough up thick spit that is yellowish-white or green.   Your breathing becomes worse when you exercise.   You are running out of the medicine you take for your breathing.  MAKE SURE YOU:   Understand these instructions.   Will watch your condition.   Will get help right away if you are not doing well or get worse.  Document Released: 05/15/2008 Document Revised: 11/16/2011 Document Reviewed: 01/27/2011 [facility] Patient Information 2012 [place], [place].

## 2012-04-19 NOTE — ED Provider Notes (Signed)
History     CSN: [id number]  Arrival date & time 04/19/12  1123   First MD Initiated Contact with Patient 04/19/12 1142      Chief Complaint  Patient presents with  . Cough    x 1 week    (Consider location/radiation/quality/duration/timing/severity/associated sxs/prior treatment) Patient is a 41 y.o. female presenting with cough.  Cough   Patient who has hx of COPD and asthma that is followed by [facility] and Dr. Felipa Eth pulmonology presents to ER complaining of a 1 week hx of gradual onset cough and mild SOB with intermittent productive yellow sputum. Patient denies fevers. She states "I think my allergies are affecting my lungs" stating that she is on daily zyrtec without relief of cough. Patient states she has hx of "flares" of COPD that improves with steroid use by has not been on oral steroids within in the last 60 days. She denies fevers, chills, CP, wheezing, abdominal pain, n/v, lower extremity pain or swelling.   Past Medical History  Diagnosis Date  . COPD (chronic obstructive pulmonary disease)   . Hidradenitis suppurativa   . Asthma     PFT 6/11 FEV1 76%, 13% chagne sp BD.   Marland Kitchen Benign essential hypertension   . Morbid obesity   . Chest pain   . Obstructive sleep apnea     History reviewed. No pertinent past surgical history.  Family History  Problem Relation Age of Onset  . Alcohol abuse Sister   . Asthma Other     History  Substance Use Topics  . Smoking status: Former Smoker -- 20 years    Types: Cigarettes    Quit date: 12/11/2009  . Smokeless tobacco: Never Used  . Alcohol Use: Yes     occasionally    OB History    Grav Para Term Preterm Abortions TAB SAB Ect Mult Living                  Review of Systems  Respiratory: Positive for cough.   All other systems reviewed and are negative.    Allergies  Aspirin  Home Medications   Current Outpatient Rx  Name Route Sig Dispense Refill  . ALBUTEROL SULFATE HFA 108 (90 BASE) MCG/ACT IN AERS  Inhalation Inhale 2 puffs into the lungs every 4 (four) hours as needed. FOR SHORTNESS OF BREATH 4 Inhaler 3  . ALBUTEROL SULFATE (2.5 MG/3ML) 0.083% IN NEBU Nebulization Take 3 mLs (2.5 mg total) by nebulization every 6 (six) hours as needed. FOR SHORTNESS OF BREATH 75 mL 4  . BUDESONIDE-FORMOTEROL FUMARATE 160-4.5 MCG/ACT IN AERO Inhalation Inhale 2 puffs into the lungs 2 (two) times daily. 2 Inhaler 3  . HYDROCHLOROTHIAZIDE 25 MG PO TABS Oral Take 1 tablet (25 mg total) by mouth daily. 90 tablet 4  . NAPROXEN 500 MG PO TABS Oral Take 1 tablet (500 mg total) by mouth 2 (two) times daily with a meal. 120 tablet 4  . OMEPRAZOLE 20 MG PO CPDR Oral Take 1 capsule (20 mg total) by mouth daily. 30 capsule 3  . PREDNISONE 50 MG PO TABS  Take 1 tablet by mouth daily for total of 5 days. 5 tablet 0    BP 112/96  Pulse 100  Temp(Src) 99 F (37.2 C) (Oral)  Resp 18  Wt 279 lb (126.554 kg)  SpO2 96%  LMP 04/18/2012  Physical Exam  Vitals reviewed. Constitutional: She is oriented to person, place, and time. She appears well-developed and well-nourished. No distress.  Morbidly obese  HENT:  Head: Normocephalic and atraumatic.  Right Ear: External ear normal.  Left Ear: External ear normal.  Nose: Nose normal.  Mouth/Throat: No oropharyngeal exudate.       Mild erythema of posterior pharynx and tonsils no tonsillar exudate or enlargement. Patent airway. Swallowing secretions well  Eyes: Conjunctivae are normal.  Neck: Normal range of motion. Neck supple.  Cardiovascular: Normal rate, regular rhythm and normal heart sounds.  Exam reveals no gallop and no friction rub.   No murmur heard. Pulmonary/Chest: Effort normal and breath sounds normal. No respiratory distress. She has no wheezes. She has no rales. She exhibits no tenderness.  Abdominal: Soft. She exhibits no distension and no mass. There is no tenderness. There is no rebound and no guarding.  Musculoskeletal: She exhibits no edema  and no tenderness.  Lymphadenopathy:    She has no cervical adenopathy.  Neurological: She is alert and oriented to person, place, and time. She has normal reflexes.  Skin: Skin is warm and dry. No rash noted. She is not diaphoretic.  Psychiatric: She has a normal mood and affect.    ED Course  Procedures (including critical care time)  PO prednisone  Labs Reviewed - No data to display No results found.   1. COPD (chronic obstructive pulmonary disease)       MDM  Afebrile, non toxic appearing in NAD with normal lung exam and pulse ox >96% on room air. Patient states that she feels well but that she thinks a steroid dose pack would improve symptoms as they have in the past. She has close follow up with pulmonologist in 5 days and has not been on steroids in the last 60 days. I spoke at length with patient about the risk and benefit of steroid use and the need for overall better control of COPD and asthma to reduce occurrence of oral steroid use. Patient voices understanding. She states she feels well to go home to follow up with Dr. Felipa Eth but will return to [facility] ER at any time for emergent changing or worsening of symptoms.         Jenness Corner, [place] 04/19/12 1213

## 2012-04-19 NOTE — ED Notes (Signed)
Pt states "been coughing x 1 wk, I go to [facility], they usually give me prednisone when I get this, cough up yellow";

## 2012-04-22 ENCOUNTER — Ambulatory Visit (INDEPENDENT_AMBULATORY_CARE_PROVIDER_SITE_OTHER): Payer: Self-pay | Admitting: Family Medicine

## 2012-04-22 ENCOUNTER — Encounter: Payer: Self-pay | Admitting: Family Medicine

## 2012-04-22 ENCOUNTER — Ambulatory Visit (HOSPITAL_COMMUNITY)
Admission: RE | Admit: 2012-04-22 | Discharge: 2012-04-22 | Disposition: A | Discharge status: Home / Self Care | Payer: Self-pay | Source: Ambulatory Visit | Attending: Family Medicine | Admitting: Family Medicine

## 2012-04-22 VITALS — BP 128/89 | HR 80 | Ht 65.0 in | Wt 282.0 lb

## 2012-04-22 DIAGNOSIS — M898X9 Other specified disorders of bone, unspecified site: Secondary | ICD-10-CM | POA: Insufficient documentation

## 2012-04-22 DIAGNOSIS — E669 Obesity, unspecified: Secondary | ICD-10-CM | POA: Insufficient documentation

## 2012-04-22 DIAGNOSIS — M25562 Pain in left knee: Secondary | ICD-10-CM

## 2012-04-22 DIAGNOSIS — M25569 Pain in unspecified knee: Secondary | ICD-10-CM | POA: Insufficient documentation

## 2012-04-22 DIAGNOSIS — G8929 Other chronic pain: Secondary | ICD-10-CM | POA: Insufficient documentation

## 2012-04-22 NOTE — ED Provider Notes (Signed)
Medical screening examination/treatment/procedure(s) were performed by non-physician practitioner and as supervising physician I was immediately available for consultation/collaboration.  Carman Auxier, MD 04/22/12 0715 

## 2012-04-22 NOTE — Progress Notes (Signed)
  Subjective:    Patient ID: Joan Brown, female    DOB: July 15, 1971, 41 y.o.   MRN: [id number]  HPI  Bilateral knee pain for many years. It is worsening our last 2-3 months. Painless walking and standing, particularly with climbing the stairs. In the last few weeks she's also noticed pain at rest. She has night pain but but does not awaken her from sleep. She's been trying to exercise and lose weight and everything she does seems to make her knees hurt worse.  PERTINENT  PMH / PSH: History as a child of some type of left knee injuries where she was in the hospital for 2 weeks. Reports she was supposed to have surgery done but she chickened out. Review of Systems    denies fever, sweats, chills. Has noted no erythema of the knees. Objective:   Physical Exam Vital signs reviewed. GENERAL: Well developed, well nourished, no acute distress. Obese KNEES: Full range of motion in extension and flexion. Immensely intact. Small effusion on the left knee, none on the right. Tenderness to palpation over the medial joint line on both knees. The popliteal space and calves are benign.        Assessment & Plan:  #13 bilateral knee pain which I suspect is related to osteoarthritis. We'll get standing knee films. We have discussed weight loss. Have given her recommendations about typical exercises to do including a home exercise program for general knee strengthening. I'll see her back after the knee films and we can discuss whether or not a corticosteroid injection would be beneficial.

## 2012-04-22 NOTE — Patient Instructions (Addendum)
I think your knee pain is most likely related to osteoarthritis. I have set you up for some x-rays so we can further evaluate this. I would like to see back after the x-ray so we can discuss whether or not a corticosteroid injection might benefit you. Long-term, absolute best thing he can do is lose weight. For every single pound of weight usually she take 4 pounds of force of her knees. For exercise I would recommend walking, cycling or riding a stationary bike with the tension set at a low number with the goal of 45 minutes a day. Using the elliptical machine also be an option.  I have given your handout on a home exercise program for general knee strengthening. I would recommend she do these at least 5 times a week. Spending about 20-30 minutes a day doing his exercises is ideal. I will see her back in a week or so.

## 2012-04-26 ENCOUNTER — Encounter: Payer: Self-pay | Admitting: Family Medicine

## 2012-05-21 ENCOUNTER — Encounter: Payer: Self-pay | Admitting: Dietician

## 2012-06-03 ENCOUNTER — Encounter: Payer: Self-pay | Admitting: Dietician

## 2012-06-10 NOTE — Addendum Note (Signed)
Addended by: Neomia Dear on: 06/10/2012 03:57 PM   Modules accepted: Orders

## 2012-08-06 ENCOUNTER — Ambulatory Visit (INDEPENDENT_AMBULATORY_CARE_PROVIDER_SITE_OTHER): Payer: Self-pay | Admitting: Internal Medicine

## 2012-08-06 ENCOUNTER — Encounter: Payer: Self-pay | Admitting: Internal Medicine

## 2012-08-06 VITALS — BP 121/80 | HR 86 | Temp 98.5°F | Wt 279.6 lb

## 2012-08-06 DIAGNOSIS — M25569 Pain in unspecified knee: Secondary | ICD-10-CM

## 2012-08-06 DIAGNOSIS — M25561 Pain in right knee: Secondary | ICD-10-CM

## 2012-08-06 DIAGNOSIS — L732 Hidradenitis suppurativa: Secondary | ICD-10-CM

## 2012-08-06 DIAGNOSIS — J45909 Unspecified asthma, uncomplicated: Secondary | ICD-10-CM

## 2012-08-06 NOTE — Patient Instructions (Signed)
Joint Injection Care After Refer to this sheet in the next few days. These instructions provide you with information on caring for yourself after you have had a joint injection. Your caregiver also may give you more specific instructions. Your treatment has been planned according to current medical practices, but problems sometimes occur. Call your caregiver if you have any problems or questions after your procedure. After any type of joint injection, it is not uncommon to experience:  Soreness, swelling, or bruising around the injection site.   Mild numbness, tingling, or weakness around the injection site caused by the numbing medicine used before or with the injection.  It also is possible to experience the following effects associated with the specific agent after injection:  Iodine-based contrast agents:   Allergic reaction (itching, hives, widespread redness, and swelling beyond the injection site).   Corticosteroids (These effects are rare.):   Allergic reaction.   Increased blood sugar levels (If you have diabetes and you notice that your blood sugar levels have increased, notify your caregiver).   Increased blood pressure levels.   Mood swings.   Hyaluronic acid in the use of viscosupplementation.   Temporary heat or redness.   Temporary rash and itching.   Increased fluid accumulation in the injected joint.  These effects all should resolve within a day after your procedure.  HOME CARE INSTRUCTIONS  Limit yourself to light activity the day of your procedure. Avoid lifting heavy objects, bending, stooping, or twisting.   Take prescription or over-the-counter pain medication as directed by your caregiver.   You may apply ice to your injection site to reduce pain and swelling the day of your procedure. Ice may be applied 3 to 4 times:   Put ice in a plastic bag.   Place a towel between your skin and the bag.   Leave the ice on for no longer than 15 to 20 minutes  each time.  SEEK IMMEDIATE MEDICAL CARE IF:   Pain and swelling get worse rather than better or extend beyond the injection site.   Numbness does not go away.   Blood or fluid continues to leak from the injection site.   You have chest pain.   You have swelling of your face or tongue.   You have trouble breathing or you become dizzy.   You develop a fever, chills, or severe tenderness at the injection site that last longer than 1 day.  MAKE SURE YOU:  Understand these instructions.   Watch your condition.   Get help right away if you are not doing well or if you get worse.  Document Released: 08/10/2011 Document Revised: 11/16/2011 Document Reviewed: 08/10/2011 ExitCare Patient Information 2012 [employer]. 

## 2012-08-06 NOTE — Progress Notes (Signed)
Subjective:   Patient ID: Joan Brown female   DOB: January 29, 1971 41 y.o.   MRN: [id number]  HPI: Joan Brown is a 41 y.o.   1. Difficulty breathing night: Started around 1995; worsening problem for >65months; using rescue inhaler 3-4x/night; did sleep study; declined prednisone treatment in an asthma study she is participating in; Follows with Dr. Vassie Loll - she has not discussed this problem with him. Uses symbicort BID; Uses spiriva daily.  Happens occasionally when awake if eating.  Choking on saliva. A few times she couldn't lay flat, and had to sit up to doze off and then felt like she was going to choke to death.  Noticed b/l ankles/feet.  Difficulty getting out of bed in the morning.   Dr. Reginia Naas note: July 01, 2010 --  Cough & dyspnea better. has been unable to enroll with [facility]. Sharing meds with fiance.  Her PFT showed FEV1 2.20 l/m (76%) , s/p bronchodilator w 13% change. I reviewed her PFT s results. Reports excessive daytime somnolence, loud snoring, choking episodes that wake her up.    2. Left knee pain: since 2010, progressively worsening; h/o left knee trauma in high school and then popped in 2010; told she had OA by sports medicine; No alleviating factors; Pain worse with walking, esp walking stairs.  Feels knee pop/lock.  Knee also lets out from under her. 2lb weight loss since May, but when on steroids, weight increases.   Tried: Naprosyn, Vicodin (worked ok but drowsy), tramadol (sleepy), tylenol 3 (doesn't work, sleepy), mobic (didn't help), bengay (soothes for a little while) Hasn't tried: voltaren gel, capsaicen cream, sulindac  3. Boil: right anterior pubic wall & between buttocks; noticed for a few days, hurting, seems to be worsening, tried rubbing EtOH; has had similar episode in the past that burst and left a hole (buttocks) - 1995  Past Medical History  Diagnosis Date  . COPD (chronic obstructive pulmonary disease)   . Hidradenitis suppurativa   . Asthma    PFT 6/11 FEV1 76%, 13% chagne sp BD.   Marland Kitchen Benign essential hypertension   . Morbid obesity   . Chest pain   . Obstructive sleep apnea    Current Outpatient Prescriptions  Medication Sig Dispense Refill  . albuterol (PROVENTIL HFA;VENTOLIN HFA) 108 (90 BASE) MCG/ACT inhaler Inhale 2 puffs into the lungs every 4 (four) hours as needed. FOR SHORTNESS OF BREATH  4 Inhaler  3  . albuterol (PROVENTIL) (2.5 MG/3ML) 0.083% nebulizer solution Take 3 mLs (2.5 mg total) by nebulization every 6 (six) hours as needed. FOR SHORTNESS OF BREATH  75 mL  4  . budesonide-formoterol (SYMBICORT) 160-4.5 MCG/ACT inhaler Inhale 2 puffs into the lungs 2 (two) times daily.  2 Inhaler  3  . hydrochlorothiazide (HYDRODIURIL) 25 MG tablet Take 1 tablet (25 mg total) by mouth daily.  90 tablet  4  . naproxen (NAPROSYN) 500 MG tablet Take 1 tablet (500 mg total) by mouth 2 (two) times daily with a meal.  120 tablet  4  . omeprazole (PRILOSEC) 20 MG capsule Take 1 capsule (20 mg total) by mouth daily.  30 capsule  3  . tiotropium (SPIRIVA) 18 MCG inhalation capsule Place 18 mcg into inhaler and inhale daily.       Family History  Problem Relation Age of Onset  . Alcohol abuse Sister   . Asthma Other    History   Social History  . Marital Status: Single    Spouse Name: N/A  Number of Children: 1  . Years of Education: N/A   Occupational History  . hair stylist    Social History Main Topics  . Smoking status: Former Smoker -- 20 years    Types: Cigarettes    Quit date: 12/11/2009  . Smokeless tobacco: Never Used  . Alcohol Use: Yes     occasionally  . Drug Use: No  . Sexually Active: None   Other Topics Concern  . None   Social History Narrative   Financial assistance approved for 100% discount at [facility] and has [facility] card per Gavin [place]12/01/2010 patient is single with no children she is a hairstylist   Review of Systems: Constitutional: Denies fever, chills, diaphoresis, appetite change and fatigue.   HEENT: Denies photophobia, eye pain, redness, hearing loss, ear pain, congestion, sore throat, rhinorrhea, sneezing, mouth sores, trouble swallowing, neck pain, neck stiffness and tinnitus.   Respiratory: Denies SOB, DOE, cough, chest tightness,  and wheezing.   Cardiovascular: Denies chest pain, palpitations and leg swelling.  Gastrointestinal: Denies nausea, vomiting, abdominal pain, diarrhea, constipation, blood in stool and abdominal distention.  Genitourinary: Denies dysuria, urgency, frequency, hematuria, flank pain and difficulty urinating.  Musculoskeletal: Denies myalgias, back pain, joint swelling, arthralgias and gait problem.  Skin: Denies pallor, rash and wound.  Neurological: Denies dizziness, seizures, syncope, weakness, light-headedness, numbness and headaches.  Hematological: Denies adenopathy. Easy bruising, personal or family bleeding history  Psychiatric/Behavioral: Denies suicidal ideation, mood changes, confusion, nervousness, sleep disturbance and agitation  Objective:  Physical Exam: Filed Vitals:   08/06/12 1447  BP: 121/80  Pulse: 86  Temp: 98.5 F (36.9 C)  TempSrc: Oral  Weight: 279 lb 9.6 oz (126.826 kg)  SpO2: 97%   Constitutional: Vital signs reviewed.  Patient is an obese woman in no mild distress and cooperative with exam.  Mouth: no erythema or exudates, MMM Eyes: PERRL, EOMI, conjunctivae normal, No scleral icterus.  Cardiovascular: RRR, S1 normal, S2 normal, no MRG, pulses symmetric and intact bilaterally, 1+ b/l LE pitting edema Pulmonary/Chest: CTAB, no wheezes, rales, or rhonchi Abdominal: Soft. Non-tender, non-distended, bowel sounds are normal, no masses, organomegaly, or guarding present.  Musculoskeletal: mild crepitis on flexion/extension of left knee without effusion or erythema Neurological: A&O x3 Skin: intergluteal mass about 2-3cm, fluctuant, tender to palpation with about 1cm of surrounding induration, right anterior pubic wall papule  that has spontaneously drained with 2-56mm of surrounding induration   Assessment & Plan:  Case and care discussed with Dr. Eben Burow.  Patient's left knee was injected today and patient to return on 08/07/12 for I&D of intergluteal mass.  Please see problem oriented charting for further detail.

## 2012-08-07 ENCOUNTER — Encounter: Payer: Self-pay | Admitting: Internal Medicine

## 2012-08-07 NOTE — Assessment & Plan Note (Signed)
Offered left knee Kenolog/Lidocaine injection.  Benefits of procedure were explained, primarily pain relief.  Harm of benefits explained, including bleeding/infection/post procedural discomfort.  Harm of not having injection explained - possible persistent pain.  Patient was consented.  Time out performed.  Approx 1cc of Kenalog and 1cc of Lidocaine were injected into left knee after knee was adequately prepped and disinfected.  Dr. Eben Burow assisted with procedure.  CMA Lynn Ito was present for procedure.

## 2012-08-07 NOTE — Assessment & Plan Note (Signed)
Chronic problem. Lungs are clear on exam. May consider echo in setting of LE edema.  Will reconsider during visit on 08/07/12.

## 2012-08-07 NOTE — Assessment & Plan Note (Signed)
Appears to have 2 papules, one is intergluteal and one is on the right anterior pubic wall.  She would like to have intergluteal papule drained, but unable to tolerate procedure on 08/06/12 after knee injection.  Patient to return on 08/07/12 for procedure.  Papule on right anterior pubic wall already draining.  Patient instructed to exfoliate after pubic hair removal to avoid ingrown hair.

## 2012-08-13 ENCOUNTER — Encounter: Payer: Self-pay | Admitting: Internal Medicine

## 2012-08-13 ENCOUNTER — Telehealth: Payer: Self-pay | Admitting: *Deleted

## 2012-08-13 ENCOUNTER — Ambulatory Visit (INDEPENDENT_AMBULATORY_CARE_PROVIDER_SITE_OTHER): Payer: Self-pay | Admitting: Internal Medicine

## 2012-08-13 ENCOUNTER — Encounter (INDEPENDENT_AMBULATORY_CARE_PROVIDER_SITE_OTHER): Payer: Self-pay | Admitting: Surgery

## 2012-08-13 ENCOUNTER — Ambulatory Visit (INDEPENDENT_AMBULATORY_CARE_PROVIDER_SITE_OTHER): Payer: Self-pay | Admitting: Surgery

## 2012-08-13 VITALS — BP 112/70 | HR 98 | Temp 96.2°F | Ht 66.0 in | Wt 280.4 lb

## 2012-08-13 VITALS — BP 126/86 | HR 82 | Temp 97.7°F | Ht 65.0 in | Wt 281.5 lb

## 2012-08-13 DIAGNOSIS — L0501 Pilonidal cyst with abscess: Secondary | ICD-10-CM | POA: Insufficient documentation

## 2012-08-13 DIAGNOSIS — L732 Hidradenitis suppurativa: Secondary | ICD-10-CM

## 2012-08-13 NOTE — Telephone Encounter (Signed)
Pt states she was in [place] this weekend went to [facility] ED, she was told she has a pilonidal cyst, it was opened and drained, she was told she needs surgery and needs a referral from her PCP, appt per sharonb. Today at 1315 dr Tonny Branch

## 2012-08-13 NOTE — Assessment & Plan Note (Signed)
She has a history of abscess in the groin, under the breast, bilateral axilla, and on her gluteal cleft.  She currently has an abscess that was opened and drained at [facility] in the ED.  She was started on Clindamycin and has not had a recurrence of her fever.  She did not bring the ED paperwork today and states that it is all the way back in [facility].  On inspection of the wound she has packing in place but there is a large area of induration with no fluctuance noted today.  She had originally been scheduled to return to the clinic on 8/28 to have this drained but did not keep that appointment.  I called the Nurse triage over at [facility] surgery and they advised that they could see her today at 4 pm.  The bandage was changed over the gluteal wound but the packing was left in place.  She should continue on the Clindamycin for antibiotic coverage and she was encouraged to call [facility] and have them send a copy of her visit and any culture results to [place] for our records.  I appreciate [facility]'s ability to fit her in today.

## 2012-08-13 NOTE — Progress Notes (Signed)
Urgent office on 08/13/12. This patient underwent incision and drainage of a pilonidal abscess in the emergency department at [facility] on 08/09/12. She comes in today to have the packing removed. It is still quite sensitive and is draining some fluid. She is afebrile with stable vital signs. The patient has a 1/2 cm longitudinal incision with minimal surrounding erythema or induration. We removed a very long length of packing with some purulent drainage. The wound is repacked with about 12 cm of Nu Gauze. A dry dressing was applied. We will recheck her in 2 days for packing change. Continue pain medication and antibiotics.  Wilmon Arms. Corliss Skains, MD, [facility] Surgery  08/13/2012 5:09 PM

## 2012-08-13 NOTE — Telephone Encounter (Signed)
Thanks will see. 

## 2012-08-13 NOTE — Progress Notes (Signed)
Subjective:   Patient ID: Joan Brown female   DOB: 03/25/1971 41 y.o.   MRN: [id number]  HPI: Joan Brown is a 41 y.o. woman who presents to clinic today for ED follow up from Saturday.  She states that last Monday noticed pain and swelling in the top part of her gluteal cleft.  She states that it was sore but was not draining.  She had something similar in the early 1990s but that drained and resolved on its own.  Saturday she states she had a fever of 105F which prompted her visit to [facility] ED.  She states that in the ED they cut it open and drained.  She was discharged home on oxycodone for pain and Clindamycin for antibiotics and was told to follow up in about 2 days.  She states that she was told she needed to see a Careers adviser.  She just picked up the prescription for the Clindamycin today.  She denies fever, chills, nausea, vomiting, or abdominal pain.  She notes there has been a lot of drainage from the area that has been "bloody."      Past Medical History  Diagnosis Date  . COPD (chronic obstructive pulmonary disease)   . Hidradenitis suppurativa   . Asthma     PFT 6/11 FEV1 76%, 13% chagne sp BD.   Marland Kitchen Benign essential hypertension   . Morbid obesity   . Chest pain   . Obstructive sleep apnea    Current Outpatient Prescriptions  Medication Sig Dispense Refill  . albuterol (PROVENTIL HFA;VENTOLIN HFA) 108 (90 BASE) MCG/ACT inhaler Inhale 2 puffs into the lungs every 4 (four) hours as needed. FOR SHORTNESS OF BREATH  4 Inhaler  3  . albuterol (PROVENTIL) (2.5 MG/3ML) 0.083% nebulizer solution Take 3 mLs (2.5 mg total) by nebulization every 6 (six) hours as needed. FOR SHORTNESS OF BREATH  75 mL  4  . budesonide-formoterol (SYMBICORT) 160-4.5 MCG/ACT inhaler Inhale 2 puffs into the lungs 2 (two) times daily.  2 Inhaler  3  . hydrochlorothiazide (HYDRODIURIL) 25 MG tablet Take 1 tablet (25 mg total) by mouth daily.  90 tablet  4  . naproxen (NAPROSYN) 500 MG tablet Take 1 tablet (500 mg  total) by mouth 2 (two) times daily with a meal.  120 tablet  4  . omeprazole (PRILOSEC) 20 MG capsule Take 1 capsule (20 mg total) by mouth daily.  30 capsule  3  . tiotropium (SPIRIVA) 18 MCG inhalation capsule Place 18 mcg into inhaler and inhale daily.       Family History  Problem Relation Age of Onset  . Alcohol abuse Sister   . Asthma Other    History   Social History  . Marital Status: Single    Spouse Name: N/A    Number of Children: 1  . Years of Education: N/A   Occupational History  . hair stylist    Social History Main Topics  . Smoking status: Former Smoker -- 20 years    Types: Cigarettes    Quit date: 12/11/2009  . Smokeless tobacco: Never Used  . Alcohol Use: Yes     occasionally  . Drug Use: No  . Sexually Active: None   Other Topics Concern  . 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   Objective:  Physical Exam: Filed Vitals:   08/13/12 1342  BP: 126/86  Pulse: 82  Temp: 97.7 F (36.5 C)  TempSrc: Oral  Height: 5\' 5"  (1.651 m)  Weight: 281 lb 8 oz (127.688 kg)  SpO2: 99%   Constitutional: Vital signs reviewed.  Patient is a well-developed and well-nourished obese woman in mild distress from pain. She is cooperative with exam. Alert and oriented x3.  Head: Normocephalic and atraumatic Ear: TM normal bilaterally Mouth: no erythema or exudates, MMM Eyes: PERRL, EOMI, conjunctivae normal, No scleral icterus.  Neck: Supple, Trachea midline normal ROM, No JVD, mass, thyromegaly, or carotid bruit present.  Cardiovascular: RRR, S1 normal, S2 normal, no MRG, pulses symmetric and intact bilaterally Pulmonary/Chest: CTAB, no wheezes, rales, or rhonchi Abdominal: Soft. Non-tender, non-distended, bowel sounds are normal, no masses, organomegaly, or  guarding present.  GU: no CVA tenderness Musculoskeletal: No joint deformities, erythema, or stiffness, ROM full and no nontender Hematology: no cervical, inginal, or axillary adenopathy.   Skin: Warm, dry and intact. There is a 3 x 4 cm area of induration in the gluteal cleft with an incision and packing in place.  Bandage shows a bloody purulent drainage.  No odor or surrounding area of erythema.  No rash, cyanosis, or clubbing.  Psychiatric: Normal mood and anxious affect. speech and behavior is normal. Judgment and thought content normal. Cognition and memory are normal.   Assessment & Plan:

## 2012-08-13 NOTE — Patient Instructions (Addendum)
1.  Continue the antibiotic as directed.  2.  You have a follow up appointment at 4 pm today with the General surgery clinic to further address the cyst.  3.  Continue the pain medication as needed.    4.  Follow up in 1-2 weeks to ensure this is resolving.

## 2012-08-14 ENCOUNTER — Ambulatory Visit: Payer: Self-pay | Admitting: Internal Medicine

## 2012-08-16 ENCOUNTER — Encounter (INDEPENDENT_AMBULATORY_CARE_PROVIDER_SITE_OTHER): Payer: Self-pay | Admitting: Surgery

## 2012-08-16 ENCOUNTER — Ambulatory Visit (INDEPENDENT_AMBULATORY_CARE_PROVIDER_SITE_OTHER): Payer: Self-pay | Admitting: Surgery

## 2012-08-16 VITALS — BP 142/84 | HR 70 | Temp 96.7°F | Ht 66.0 in | Wt 278.2 lb

## 2012-08-16 DIAGNOSIS — L0501 Pilonidal cyst with abscess: Secondary | ICD-10-CM

## 2012-08-16 NOTE — Progress Notes (Signed)
Dressing changes - still with some purulence draining from pilonidal incision; no cellulitis; Packed with 1/4 inch nugauze.  Will return Monday for dressing change.    I will follow her long-term.  Wilmon Arms. Corliss Skains, MD, [facility]  08/16/2012 10:02 AM

## 2012-08-19 ENCOUNTER — Ambulatory Visit (INDEPENDENT_AMBULATORY_CARE_PROVIDER_SITE_OTHER): Payer: Self-pay | Admitting: General Surgery

## 2012-08-19 ENCOUNTER — Encounter (INDEPENDENT_AMBULATORY_CARE_PROVIDER_SITE_OTHER): Payer: Self-pay | Admitting: General Surgery

## 2012-08-19 VITALS — BP 120/83 | HR 72 | Temp 97.8°F | Resp 16 | Ht 66.0 in | Wt 282.4 lb

## 2012-08-19 DIAGNOSIS — Z4800 Encounter for change or removal of nonsurgical wound dressing: Secondary | ICD-10-CM

## 2012-08-22 ENCOUNTER — Encounter (INDEPENDENT_AMBULATORY_CARE_PROVIDER_SITE_OTHER): Payer: Self-pay | Admitting: Surgery

## 2012-08-28 ENCOUNTER — Encounter (INDEPENDENT_AMBULATORY_CARE_PROVIDER_SITE_OTHER): Payer: Self-pay | Admitting: Surgery

## 2012-09-12 ENCOUNTER — Ambulatory Visit (INDEPENDENT_AMBULATORY_CARE_PROVIDER_SITE_OTHER): Payer: PRIVATE HEALTH INSURANCE | Admitting: Surgery

## 2012-09-12 ENCOUNTER — Encounter (INDEPENDENT_AMBULATORY_CARE_PROVIDER_SITE_OTHER): Payer: Self-pay | Admitting: Surgery

## 2012-09-12 VITALS — BP 136/84 | HR 80 | Temp 98.5°F | Resp 16 | Ht 66.0 in | Wt 274.8 lb

## 2012-09-12 DIAGNOSIS — L0501 Pilonidal cyst with abscess: Secondary | ICD-10-CM

## 2012-09-12 NOTE — Progress Notes (Signed)
Patient ID: Joan Brown, female   DOB: Nov 07, 1971, 41 y.o.   MRN: [id number] Recheck of her pilonidal abscess. The wound is completely healed. No sign of inflammation. No drainage. No surrounding erythema. She is a firm knot of scar tissue under the incision but this seems to be noninflamed. He seems to be getting smaller. No further intervention at this time. If the mass does not resolve or gets larger we should see her back as needed.  Wilmon Arms. Corliss Skains, MD, [facility] Surgery  09/12/2012 3:01 PM

## 2012-09-19 ENCOUNTER — Encounter: Payer: Self-pay | Admitting: Internal Medicine

## 2012-09-20 ENCOUNTER — Ambulatory Visit (INDEPENDENT_AMBULATORY_CARE_PROVIDER_SITE_OTHER): Payer: PRIVATE HEALTH INSURANCE | Admitting: Internal Medicine

## 2012-09-20 ENCOUNTER — Encounter: Payer: Self-pay | Admitting: Internal Medicine

## 2012-09-20 VITALS — BP 167/101 | HR 72 | Temp 98.5°F | Ht 66.0 in | Wt 288.4 lb

## 2012-09-20 DIAGNOSIS — Z0000 Encounter for general adult medical examination without abnormal findings: Secondary | ICD-10-CM

## 2012-09-20 DIAGNOSIS — M7989 Other specified soft tissue disorders: Secondary | ICD-10-CM

## 2012-09-20 DIAGNOSIS — Z23 Encounter for immunization: Secondary | ICD-10-CM

## 2012-09-20 DIAGNOSIS — G4730 Sleep apnea, unspecified: Secondary | ICD-10-CM

## 2012-09-20 DIAGNOSIS — S025XXA Fracture of tooth (traumatic), initial encounter for closed fracture: Secondary | ICD-10-CM | POA: Insufficient documentation

## 2012-09-20 DIAGNOSIS — J45909 Unspecified asthma, uncomplicated: Secondary | ICD-10-CM

## 2012-09-20 DIAGNOSIS — M25569 Pain in unspecified knee: Secondary | ICD-10-CM

## 2012-09-20 DIAGNOSIS — I10 Essential (primary) hypertension: Secondary | ICD-10-CM

## 2012-09-20 LAB — BASIC METABOLIC PANEL
Chloride: 106 mEq/L (ref 96–112)
Potassium: 3.5 mEq/L (ref 3.5–5.3)
Sodium: 141 mEq/L (ref 135–145)

## 2012-09-20 LAB — PRO B NATRIURETIC PEPTIDE: Pro B Natriuretic peptide (BNP): 223.7 pg/mL — ABNORMAL HIGH (ref <126)

## 2012-09-20 MED ORDER — NAPROXEN 500 MG PO TABS: 500.0000 mg | ORAL_TABLET | Freq: Two times a day (BID) | ORAL | Status: DC

## 2012-09-20 MED ORDER — FLUTICASONE-SALMETEROL 500-50 MCG/DOSE IN AEPB: 1.0000 | INHALATION_SPRAY | Freq: Two times a day (BID) | RESPIRATORY_TRACT | Status: DC

## 2012-09-20 MED ORDER — ALBUTEROL SULFATE (2.5 MG/3ML) 0.083% IN NEBU: 2.5000 mg | INHALATION_SOLUTION | Freq: Four times a day (QID) | RESPIRATORY_TRACT | Status: DC | PRN

## 2012-09-20 MED ORDER — OMEPRAZOLE 20 MG PO CPDR: 20.0000 mg | DELAYED_RELEASE_CAPSULE | Freq: Every day | ORAL | Status: DC

## 2012-09-20 MED ORDER — AZITHROMYCIN 250 MG PO TABS: ORAL_TABLET | ORAL | Status: AC

## 2012-09-20 MED ORDER — TRAMADOL HCL 50 MG PO TABS
100.0000 mg | ORAL_TABLET | Freq: Four times a day (QID) | ORAL | Status: DC | PRN
Start: 2012-09-20 — End: 2012-10-29

## 2012-09-20 MED ORDER — FUROSEMIDE 20 MG PO TABS: 20.0000 mg | ORAL_TABLET | Freq: Every day | ORAL | Status: DC

## 2012-09-20 MED ORDER — ALBUTEROL SULFATE HFA 108 (90 BASE) MCG/ACT IN AERS: 2.0000 | INHALATION_SPRAY | RESPIRATORY_TRACT | Status: DC | PRN

## 2012-09-20 MED ORDER — ATENOLOL 50 MG PO TABS: 50.0000 mg | ORAL_TABLET | Freq: Every day | ORAL | Status: DC

## 2012-09-20 NOTE — Assessment & Plan Note (Signed)
BP elevated today -change HCTZ to lasix 20 for chronic venous insufficiency -start atenolol 50

## 2012-09-20 NOTE — Assessment & Plan Note (Signed)
Patient continue to note chronic knee pain, s/p steroid injection 2 months ago.  Not enough time to assess this issue at this time, will reassess at next visit.

## 2012-09-20 NOTE — Assessment & Plan Note (Addendum)
The patient had a sleep study 06/2011 which showed an AHI of 3.3, but an RDI of 8.3.  Patient has symptoms consistent with OSA. -since patient was on borderline for AHI criteria, and met RDI criteria for OSA, and symptoms seem to have worsened over the last year, will refer for repeat sleep study

## 2012-09-20 NOTE — Progress Notes (Signed)
HPI The patient is a 41 y.o. yo female with a history of Asthma, HTN, hydradenitis suppurativa, presenting for a routine follow-up visit.  The patient notes bilateral leg swelling when standing for long periods of time.  The swelling resolves with lying down.  She notes achy pain in her legs when the swelling is at its worst.  The patient has tried compression stockings at night, with some success.  The patient is on HCTZ 25.  Echo from 2011 showed an EF 60-65%, and patient has no prior history of heart failure.  She notes no orthopnea or PND.  The patient has a history of asthma.  She notes that for the last 3-4 months, he symptoms of dyspnea, coughing, and wheezing have worsened.  The patient notes using her albuterol nebulizer twice per day, as well as an albuterol inhaler at least twice per day.  The patient notes a severe cough for the last couple of months, worse at night, productive of "frothy" clear or yellow sputum.  No fevers.  She has been taking symbicort, but stopped taking spiriva 4-5 months ago due to triggering of cough when the medication was inhaled.  The patient was given a short course of prednisone 2 months ago (per patient report), which did not improve symptoms, and did make the patient gain weight.  The patient sees Dr. Vassie Loll at [facility], but hasn't seen him in "a long time".  The patient also notes symptoms of snoring, daytime sleepiness, poor concentration, and fatigue.  These symptoms have been present for the last year, but worse over the last 6 months.  The patient had a sleep study performed about 1.5 years ago, which showed an AHI of 3.3.  She notes that she has been using her father's CPAP machine from time to time, with significant improvement in symptoms, and would like a referral for a repeat sleep study.  The patient continues to note left knee pain, s/p corticosteroid injection 2 months ago.  She also notes a history of chronic low back pain.  The patient  notes that 1 week ago, her left upper molar tooth "broke off", and she has been having pain at that site since that time.  ROS: General: no fevers, chills, changes in weight, changes in appetite Skin: no rash HEENT: no blurry vision, hearing changes, sore throat Pulm: see HPI CV: no chest pain, palpitations Abd: no abdominal pain, nausea/vomiting, diarrhea/constipation GU: no dysuria, hematuria, polyuria Ext: see HPI Neuro: no weakness, numbness, or tingling  Filed Vitals:   09/20/12 1021  BP: 167/101  Pulse: 72  Temp: 98.5 F (36.9 C)    PEX General: sitting in chair, coughing, appears uncomfortable HEENT: right upper molar tooth with part of tooth still present in gumline, but with a portion of tooth missing. pupils equal round and reactive to light, vision grossly intact, oropharynx clear and non-erythematous  Neck: supple, no lymphadenopathy, no JVD Lungs: increased work of respiration, minimal-no wheezing, no ronchi Heart: regular rate and rhythm, no murmurs, gallops, or rubs Abdomen: soft, non-tender, non-distended, normal bowel sounds Extremities: bilateral non-pitting edema present to below the knee Neurologic: alert & oriented X3, cranial nerves II-XII intact, strength grossly intact, sensation intact to light touch  Current Outpatient Prescriptions on File Prior to Visit  Medication Sig Dispense Refill  . albuterol (PROVENTIL HFA;VENTOLIN HFA) 108 (90 BASE) MCG/ACT inhaler Inhale 2 puffs into the lungs every 4 (four) hours as needed. FOR SHORTNESS OF BREATH  4 Inhaler  3  .  albuterol (PROVENTIL) (2.5 MG/3ML) 0.083% nebulizer solution Take 3 mLs (2.5 mg total) by nebulization every 6 (six) hours as needed. FOR SHORTNESS OF BREATH  75 mL  4  . budesonide-formoterol (SYMBICORT) 160-4.5 MCG/ACT inhaler Inhale 2 puffs into the lungs 2 (two) times daily.  2 Inhaler  3  . hydrochlorothiazide (HYDRODIURIL) 25 MG tablet Take 1 tablet (25 mg total) by mouth daily.  90 tablet  4    . naproxen (NAPROSYN) 500 MG tablet Take 1 tablet (500 mg total) by mouth 2 (two) times daily with a meal.  120 tablet  4  . omeprazole (PRILOSEC) 20 MG capsule Take 1 capsule (20 mg total) by mouth daily.  30 capsule  3  . tiotropium (SPIRIVA) 18 MCG inhalation capsule Place 18 mcg into inhaler and inhale daily.        Assessment/Plan

## 2012-09-20 NOTE — Assessment & Plan Note (Signed)
Flu shot given today

## 2012-09-20 NOTE — Assessment & Plan Note (Signed)
The patient has a history of asthma, currently with symptoms of dyspnea throughout the day requiring albuterol usage at least 4 times per day.  However, she only has minimal wheeze on exam, and symptoms have been stable over several months, making acute asthma exacerbation less likely. She has been using symbicort 150-4.5 2 puffs BID (medium dose ICS + LABA), and has not been using Spiriva. -will change therapy to high dose ICS + LABA.  Stop symbicort, start Advair 500-50, 1 puff BID -patient not using Spiriva, will take off med list -patient declines short-term prednisone course.  Gave prescription for azithromycin to patient, to fill if symptoms don't improve with advair -continue albuterol neb/inhaler as needed -set up follow-up appointment with Dr. Vassie Loll for 11/6 at 10:30 am

## 2012-09-20 NOTE — Assessment & Plan Note (Signed)
The patient's bilateral leg swelling is likely due to chronic venous insufficiency.  Echo from 2011 showed no evidence of heart failure, but with patient's dyspnea (though no orthopnea, PND), will check pro-BNP today as screening test for CHF. -I think the patient will benefit most from wearing compression stockings during the day -change HCTZ to lasix, which we can taper to patient's symptoms.  Will start with low dose of 20 mg at first, but can uptitrate based on patient's symptoms

## 2012-09-20 NOTE — Assessment & Plan Note (Signed)
Patient notes right upper tooth fracture while eating, with resultant pain.  Examination of patient's mouth shows that part of the tooth is still present in the gumline, while a distal portion of the tooth is missing.  No obvious evidence of infection -patient given tramadol for pain -will refer to dentist

## 2012-09-20 NOTE — Patient Instructions (Signed)
For your Asthma, we are making the following changes: -STOP TAKING your Symbicort.  You have already stopped taking your Spiriva, it is ok to continue to not use this medication. -START taking Advair, 1 puff twice per day -we are giving you a prescription for Azithromycin (an antibiotic).  If your symptoms do not improve with Advair treatment, you may fill this prescription -please follow-up with your Pulmonologist, Dr. Vassie Loll.  We made you an appointment for November 6th, at 10:30 am.  Your leg swelling is probably due to Chronic Venous Insufficiency (see information below) -Wear compression stockings all day to prevent pooling of fluid in your legs -We are changing you to a different diuretic medication, called Lasix.  Take 1 tablet once per day -STOP taking Hydrochlorothiazide  Your blood pressure was high today -as mentioned above, stop taking Hydrochlorothiazide -start taking Lasix -start taking Atenolol, 1 tablet once per day  For your tooth pain we are referring you to a dentist.  You received a flu shot today.  It is normal to have some pain and soreness in your arm following this shot.  Please return for a follow-up visit in 1-2 months.  Venous Stasis and Chronic Venous Insufficiency As people age, the veins located in their legs may weaken and stretch. When veins weaken and lose the ability to pump blood effectively, the condition is called chronic venous insufficiency (CVI) or venous stasis. Almost all veins return blood back to the heart. This happens by:  The force of the heart pumping fresh blood pushes blood back to the heart.  Blood flowing to the heart from the force of gravity. In the deep veins of the legs, blood has to fight gravity and flow upstream back to the heart. Here, the leg muscles contract to pump blood back toward the heart. Vein walls are elastic, and many veins have small valves that only allow blood to flow in one direction. When leg muscles contract, they  push inward against the elastic vein walls. This squeezes blood upward, opens the valves, and moves blood toward the heart. When leg muscles relax, the vein wall also relaxes and the valves inside the vein close to prevent blood from flowing backward. This method of pumping blood out of the legs is called the venous pump. CAUSES  The venous pump works best while walking and leg muscles are contracting. But when a person sits or stands, blood pressure in leg veins can build. Deep veins are usually able to withstand short periods of inactivity, but long periods of inactivity (and increased pressure) can stretch, weaken, and damage vein walls. High blood pressure can also stretch and damage vein walls. The veins may no longer be able to pump blood back to the heart. Venous hypertension (high blood pressure inside veins) that lasts over time is a primary cause of CVI. CVI can also be caused by:   Deep vein thrombosis, a condition where a thrombus (blood clot) blocks blood flow in a vein.  Phlebitis, an inflammation of a superficial vein that causes a blood clot to form. Other risk factors for CVI may include:   Heredity.  Obesity.  Pregnancy.  Sedentary lifestyle.  Smoking.  Jobs requiring long periods of standing or sitting in one place.  Age and gender:  Women in their 50's and 72's and men in their 69's are more prone to developing CVI. SYMPTOMS  Symptoms of CVI may include:   Varicose veins.  Ulceration or skin breakdown.  Lipodermatosclerosis, a condition that  affects the skin just above the ankle, usually on the inside surface. Over time the skin becomes [place], smooth, tight and often painful. Those with this condition have a high risk of developing skin ulcers.  Reddened or discolored skin on the leg.  Swelling. DIAGNOSIS  Your caregiver can diagnose CVI after performing a careful medical history and physical examination. To confirm the diagnosis, the following tests may also  be ordered:   Duplex ultrasound.  Plethysmography (tests blood flow).  Venograms (x-ray using a special dye). TREATMENT The goals of treatment for CVI are to restore a person to an active life and to minimize pain or disability. Typically, CVI does not pose a serious threat to life or limb, and with proper treatment most people with this condition can continue to lead active lives. In most cases, mild CVI can be treated on an outpatient basis with simple procedures. Treatment methods include:   Elastic compression socks.  Sclerotherapy, a procedure involving an injection of a material that "dissolves" the damaged veins. Other veins in the network of blood vessels take over the function of the damaged veins.  Vein stripping (an older procedure less commonly used).  Laser Ablation surgery.  Valve repair. HOME CARE INSTRUCTIONS   Elastic compression socks must be worn every day. They can help with symptoms and lower the chances of the problem getting worse, but they do not cure the problem.  Only take over-the-counter or prescription medicines for pain, discomfort, or fever as directed by your caregiver.  Your caregiver will review your other medications with you. SEEK MEDICAL CARE IF:   You are confused about how to take your medications.  There is redness, swelling, or increasing pain in the affected area.  There is a red streak or line that extends up or down from the affected area.  There is a breakdown or loss of skin in the affected area, even if the breakdown is small.  You develop an unexplained oral temperature above 102 F (38.9 C).  There is an injury to the affected area. SEEK IMMEDIATE MEDICAL CARE IF:   There is an injury and open wound to the affected area.  Pain is not adequately relieved with pain medication prescribed or becomes severe.  An oral temperature above 102 F (38.9 C) develops.  The foot/ankle below the affected area becomes suddenly numb or  the area feels weak and hard to move. MAKE SURE YOU:   Understand these instructions.  Will watch your condition.  Will get help right away if you are not doing well or get worse. Document Released: 04/02/2007 Document Revised: 02/19/2012 Document Reviewed: 06/10/2007 [facility] Patient Information 2013 [place], [place].

## 2012-09-27 ENCOUNTER — Ambulatory Visit (HOSPITAL_BASED_OUTPATIENT_CLINIC_OR_DEPARTMENT_OTHER): Payer: Medicaid Other | Attending: Internal Medicine

## 2012-09-27 VITALS — Ht 65.0 in | Wt 272.0 lb

## 2012-09-27 DIAGNOSIS — G4733 Obstructive sleep apnea (adult) (pediatric): Secondary | ICD-10-CM | POA: Insufficient documentation

## 2012-09-27 DIAGNOSIS — G4730 Sleep apnea, unspecified: Secondary | ICD-10-CM

## 2012-09-28 ENCOUNTER — Encounter (HOSPITAL_COMMUNITY): Payer: Self-pay

## 2012-09-28 ENCOUNTER — Emergency Department (HOSPITAL_COMMUNITY)
Admission: EM | Admit: 2012-09-28 | Discharge: 2012-09-29 | Disposition: A | Discharge status: Home / Self Care | Payer: Medicaid Other | Attending: Emergency Medicine | Admitting: Emergency Medicine

## 2012-09-28 DIAGNOSIS — J4489 Other specified chronic obstructive pulmonary disease: Secondary | ICD-10-CM | POA: Insufficient documentation

## 2012-09-28 DIAGNOSIS — G4733 Obstructive sleep apnea (adult) (pediatric): Secondary | ICD-10-CM

## 2012-09-28 DIAGNOSIS — I10 Essential (primary) hypertension: Secondary | ICD-10-CM | POA: Insufficient documentation

## 2012-09-28 DIAGNOSIS — M810 Age-related osteoporosis without current pathological fracture: Secondary | ICD-10-CM | POA: Insufficient documentation

## 2012-09-28 DIAGNOSIS — J449 Chronic obstructive pulmonary disease, unspecified: Secondary | ICD-10-CM | POA: Insufficient documentation

## 2012-09-28 DIAGNOSIS — L0591 Pilonidal cyst without abscess: Secondary | ICD-10-CM

## 2012-09-28 DIAGNOSIS — L0501 Pilonidal cyst with abscess: Secondary | ICD-10-CM | POA: Insufficient documentation

## 2012-09-28 DIAGNOSIS — Z886 Allergy status to analgesic agent status: Secondary | ICD-10-CM | POA: Insufficient documentation

## 2012-09-28 DIAGNOSIS — Z87891 Personal history of nicotine dependence: Secondary | ICD-10-CM | POA: Insufficient documentation

## 2012-09-28 DIAGNOSIS — Z79899 Other long term (current) drug therapy: Secondary | ICD-10-CM | POA: Insufficient documentation

## 2012-09-28 DIAGNOSIS — M129 Arthropathy, unspecified: Secondary | ICD-10-CM | POA: Insufficient documentation

## 2012-09-28 NOTE — Procedures (Signed)
NAME:  Joan Brown, Joan Brown                 ACCOUNT NO.:  [id number]  MEDICAL RECORD NO.:  [id number]          PATIENT TYPE:  OUT  LOCATION:  SLEEP CENTER                 [facility]AC[facility]  PHYSICIAN:  Clinton D. Maple Hudson, MD, FCCP, FACPDATE OF BIRTH:  1969/02/07  DATE OF STUDY:  09/27/2012                           NOCTURNAL POLYSOMNOGRAM  REFERRING PHYSICIAN:  SHILPA BHARDWAJ  INDICATION FOR STUDY:  Hypersomnia with sleep apnea.  EPWORTH SLEEPINESS SCORE:  16/24.  BMI 45.3, weight 272 pounds, height 65 inches, neck 15 inches.  MEDICATIONS:  Home medications are charted and reviewed.  SLEEP ARCHITECTURE:  Total sleep time 307.5 minutes with sleep efficiency 80.9%.  Stage I was 5.2%, stage II 45.2%.  Stage III absent, REM 49.6% of total sleep time.  Sleep latency 20 minutes, REM latency 88 minutes.  Awake after sleep onset 49 minutes.  Arousal index 9.8. Bedtime medication:  None.  RESPIRATORY DATA:  Apnea/hypopnea index (AHI) 17.8 per hour.  A total of 91 events were scored including 16 obstructive apneas and 75 hypopneas. Events were more common while supine and in REM.  REM AHI 31.9 per hour. There were insufficient numbers of early events to permit application of split protocol, CPAP titration on this study night.  OXYGEN DATA:  Loud snoring with oxygen desaturation to a nadir of 82% and mean oxygen saturation through the study of 94.5% on room air.  CARDIAC DATA:  Normal sinus rhythm.  MOVEMENT/PARASOMNIA:  No significant movement disturbance.  No bathroom trips.  IMPRESSION/RECOMMENDATION: 1. Moderate obstructive sleep apnea/hypopnea syndrome, AHI 17.8 per     hour.  Loud snoring with oxygen desaturation to a nadir of 82% and     mean oxygen saturation through the study of 94.5% on room air. 2. There were insufficient numbers of early events to meet protocol     requirements for initiation of split protocol, CPAP titration on     this study night.  Consider return for dedicated  CPAP titration     study or evaluate for alternative management as clinically     appropriate. 3. An unusually high percentage of the night (49.6%) was spent in REM.     This can be seen in situations of "REM rebound where sleep has been     poor and REM deficient in recent days prior to the study."  This     pattern can also     be seen if a REM suppressing medication such as an antidepressant     has recently been discontinued.  If a CPAP titration study is     performed, REM percentage would be recorded again during that     study.     Clinton D. Maple Hudson, MD, [facility], FACP Diplomate, American Board of Sleep Medicine    CDY/MEDQ  D:  09/28/2012 12:37:47  T:  09/28/2012 14:02:32  Job:  [id number]

## 2012-09-28 NOTE — ED Notes (Signed)
I gave the patients visitor a cup of coffee and 4 sugars.

## 2012-09-28 NOTE — ED Notes (Signed)
Pt reports (R) knee pain d/t cyst ruptured 1 hour ago. Pt reports she was seen at [facility] 2 weeks ago packing the area.

## 2012-09-29 MED ORDER — HYDROCODONE-ACETAMINOPHEN 5-325 MG PO TABS: 1.0000 | ORAL_TABLET | ORAL | Status: DC | PRN

## 2012-09-29 MED ORDER — OXYCODONE-ACETAMINOPHEN 5-325 MG PO TABS
1.0000 | ORAL_TABLET | Freq: Once | ORAL | Status: AC
  Administered 2012-09-29: 1 via ORAL
  Filled 2012-09-29: qty 1

## 2012-09-29 NOTE — ED Notes (Signed)
PA at bedside with suture cart 

## 2012-09-29 NOTE — ED Provider Notes (Signed)
History     CSN: [id number]  Arrival date & time 09/28/12  2147   None     Chief Complaint  Patient presents with  . Cyst    (Consider location/radiation/quality/duration/timing/severity/associated sxs/prior treatment) HPI History provided by pt and prior chart.  Per prior chart, pt has a pilonidal abscess, for which she has been seen by Dr. Gwinda Passe, most recently on 09/12/12.  At that time, wound was healed w/ exception of firm knot of scar tissue under incision.  Pt was to return if symptoms worsened in future.  Pt reports that she developed severe pain in this location (right medial upper buttock) today and the cyst is now draining blood and fluid.  No associated fever and she is otherwise feeling well.   Past Medical History  Diagnosis Date  . COPD (chronic obstructive pulmonary disease)   . Hidradenitis suppurativa   . Asthma     PFT 6/11 FEV1 76%, 13% chagne sp BD.   Marland Kitchen Benign essential hypertension   . Morbid obesity   . Chest pain   . Obstructive sleep apnea   . Arthritis   . Osteoporosis     History reviewed. No pertinent past surgical history.  Family History  Problem Relation Age of Onset  . Alcohol abuse Sister   . Asthma Other   . Diabetes Mother   . Hypertension Mother   . Diabetes Father   . Hypertension Father   . Heart disease Father   . Cancer Paternal Aunt     skin  . Cancer Paternal Uncle     skin  . Cancer Maternal Grandmother     pancreatic  . Stroke Paternal Grandmother     History  Substance Use Topics  . Smoking status: Former Smoker -- 20 years    Types: Cigarettes    Quit date: 12/11/2009  . Smokeless tobacco: Never Used  . Alcohol Use: Yes     occasionally    OB History    Grav Para Term Preterm Abortions TAB SAB Ect Mult Living                  Review of Systems  All other systems reviewed and are negative.    Allergies  Aspirin  Home Medications   Current Outpatient Rx  Name Route Sig Dispense Refill  .  ALBUTEROL SULFATE HFA 108 (90 BASE) MCG/ACT IN AERS Inhalation Inhale 2 puffs into the lungs every 4 (four) hours as needed. FOR SHORTNESS OF BREATH 4 Inhaler 3  . ALBUTEROL SULFATE (2.5 MG/3ML) 0.083% IN NEBU Nebulization Take 3 mLs (2.5 mg total) by nebulization every 6 (six) hours as needed. FOR SHORTNESS OF BREATH 75 mL 4  . ATENOLOL 50 MG PO TABS Oral Take 1 tablet (50 mg total) by mouth daily. 30 tablet 11  . FLUTICASONE-SALMETEROL 500-50 MCG/DOSE IN AEPB Inhalation Inhale 1 puff into the lungs 2 (two) times daily. 60 each 4  . FUROSEMIDE 20 MG PO TABS Oral Take 1 tablet (20 mg total) by mouth daily. 30 tablet 11  . NAPROXEN 500 MG PO TABS Oral Take 1 tablet (500 mg total) by mouth 2 (two) times daily with a meal. 120 tablet 5  . OMEPRAZOLE 20 MG PO CPDR Oral Take 1 capsule (20 mg total) by mouth daily. 30 capsule 11  . TRAMADOL HCL 50 MG PO TABS Oral Take 2 tablets (100 mg total) by mouth every 6 (six) hours as needed for pain. 60 tablet 2  BP 139/73  Pulse 73  Temp 98.7 F (37.1 C) (Oral)  Resp 18  SpO2 97%  LMP 09/16/2012  Physical Exam  Nursing note and vitals reviewed. Constitutional: She is oriented to person, place, and time. She appears well-developed and well-nourished. No distress.  HENT:  Head: Normocephalic and atraumatic.  Eyes:       Normal appearance  Neck: Normal range of motion.  Pulmonary/Chest: Effort normal.  Musculoskeletal: Normal range of motion.  Neurological: She is alert and oriented to person, place, and time.  Skin:       Pilonidal abscess upper right medial buttock.  Overlying erythema.  Fluctuant and ttp.  Serous, purulent and blood drainage at small opening at gluteal cleft.     Psychiatric: She has a normal mood and affect. Her behavior is normal.    ED Course  Procedures (including critical care time) INCISION AND DRAINAGE Performed by: Otilio Miu Consent: Verbal consent obtained. Risks and benefits: risks, benefits and  alternatives were discussed Type: abscess  Body area: gluteal cleft  Anesthesia: local infiltration  Local anesthetic: lidocaine 3% w/ epinephrine  Anesthetic total: 3 ml  Complexity: complex Blunt dissection to break up loculations  Drainage: purulent and serous  Drainage amount: minimal  Packing material: 1/4 in iodoform gauze  Patient tolerance: Patient tolerated the procedure well with no immediate complications.    Labs Reviewed - No data to display No results found.   1. Infected pilonidal cyst       MDM  Pt presents w/ infected pilonidal cyst.  Has been seen by [facility] for this problem and treated conservatively w/ pain control and abx.  Recurrence of pain and drainage today.  I&D'd in ED today w/ both purulent and serous drainage.  Pt d/c'd home w/ vicodin and referral back to [facility]. Return precautions discussed.         Otilio Miu, [place] 09/29/12 [phone]

## 2012-09-29 NOTE — ED Notes (Signed)
PA at bedside again with suture cart.

## 2012-09-29 NOTE — ED Provider Notes (Signed)
Medical screening examination/treatment/procedure(s) were performed by non-physician practitioner and as supervising physician I was immediately available for consultation/collaboration.  Brandt Loosen, MD 09/29/12 [phone]

## 2012-09-29 NOTE — ED Notes (Signed)
Patient complaining of draining boil/abscess near tailbone.  Patient reports that this has been here for about two weeks.  Patient unable to state what colored drainage is draining from area.  Denies history of previous boils/abscesses.  Patient alert and oriented x4; PERRL present.  Will continue to monitor.

## 2012-09-29 NOTE — ED Notes (Signed)
Patient given copy of discharge paperwork; went over discharge instructions with patient.  Patient instructed to take narcotic as prescribed, to not drive or drink while taking narcotic, to follow up with her general surgeon, and to return to the ED for new, worsening, or concerning symptoms.

## 2012-09-29 NOTE — ED Notes (Signed)
Suture cart placed at bedside, per PA request.

## 2012-10-09 ENCOUNTER — Encounter: Payer: Self-pay | Admitting: Internal Medicine

## 2012-10-09 ENCOUNTER — Telehealth: Payer: Self-pay | Admitting: *Deleted

## 2012-10-09 ENCOUNTER — Ambulatory Visit (INDEPENDENT_AMBULATORY_CARE_PROVIDER_SITE_OTHER): Payer: No Typology Code available for payment source | Admitting: Internal Medicine

## 2012-10-09 VITALS — BP 120/80 | HR 70 | Temp 98.6°F | Ht 66.0 in | Wt 281.1 lb

## 2012-10-09 DIAGNOSIS — L0501 Pilonidal cyst with abscess: Secondary | ICD-10-CM

## 2012-10-09 DIAGNOSIS — L293 Anogenital pruritus, unspecified: Secondary | ICD-10-CM

## 2012-10-09 DIAGNOSIS — N898 Other specified noninflammatory disorders of vagina: Secondary | ICD-10-CM | POA: Insufficient documentation

## 2012-10-09 MED ORDER — FLUCONAZOLE 150 MG PO TABS: 150.0000 mg | ORAL_TABLET | Freq: Once | ORAL | Status: DC

## 2012-10-09 MED ORDER — HYDROCODONE-ACETAMINOPHEN 5-325 MG PO TABS: 1.0000 | ORAL_TABLET | Freq: Four times a day (QID) | ORAL | Status: DC | PRN

## 2012-10-09 NOTE — Telephone Encounter (Signed)
Pt called and states pilonidal cyst rupture. Recently at [facility] ER for same problem and was referred to surgeon in [place] on 10/21/12. Has no insurance. Appt made 10/09/12 2:15PM Dr Manson Passey. Stanton Kidney Teairra Millar RN 10/09/12 1:50PM

## 2012-10-09 NOTE — Progress Notes (Signed)
HPI The patient is a 41 y.o. yo female with a history of pilonidal abscesses, presenting for an acute visit for pilonidal abscess.  The patient notes a cyst in the gluteal cleft which developed 1-2 weeks ago.  She was initially seen at our ED on 10/19, then at [facility] ED on 10/22, with incision and drainage of reportedly purulent discharge, with packing of the area.  She was given a prescription for clindamycin 300 mg BID x 7 days, which she filled on 10/23., about 1 week ago seen in ED, packed, referred to surgery.   Since starting clindamycin, the patient notes development of a foul vaginal odor, thick white discharge, and itching, as well as nausea, but the patient has continued to take the medication.    ROS: General: no fevers, chills, changes in weight, changes in appetite Skin: no rash HEENT: no blurry vision, hearing changes, sore throat Pulm: no dyspnea, coughing, wheezing CV: no chest pain, palpitations, shortness of breath Abd: no abdominal pain, nausea/vomiting, diarrhea/constipation GU: no dysuria, hematuria, polyuria Ext: no arthralgias, myalgias Neuro: no weakness, numbness, or tingling  Filed Vitals:   10/09/12 1446  BP: 120/80  Pulse: 70  Temp: 98.6 F (37 C)    PEX General: alert, cooperative, appears uncomfortable HEENT: pupils equal round and reactive to light, vision grossly intact, oropharynx clear and non-erythematous  Neck: supple, no lymphadenopathy Lungs: clear to ascultation bilaterally, normal work of respiration, no wheezes, rales, ronchi Heart: regular rate and rhythm, no murmurs, gallops, or rubs Abdomen: soft, non-tender, non-distended, normal bowel sounds Gluteal: Small 5 mm incision at superior gluteal cleft with packing in place.  approx 4-5 inches of 1/4 in packing material removed from area.  A small amount of serosanguinous fluid was actively expressed from the area (no purulence).  No evidence of erythema surrounding the incision.  Area repacked  with 1/4 inch packing. Extremities: no cyanosis, clubbing, or edema Neurologic: alert & oriented X3, cranial nerves II-XII intact, strength grossly intact, sensation intact to light touch  Current Outpatient Prescriptions on File Prior to Visit  Medication Sig Dispense Refill  . albuterol (PROVENTIL HFA;VENTOLIN HFA) 108 (90 BASE) MCG/ACT inhaler Inhale 2 puffs into the lungs every 4 (four) hours as needed. FOR SHORTNESS OF BREATH  4 Inhaler  3  . albuterol (PROVENTIL) (2.5 MG/3ML) 0.083% nebulizer solution Take 3 mLs (2.5 mg total) by nebulization every 6 (six) hours as needed. FOR SHORTNESS OF BREATH  75 mL  4  . atenolol (TENORMIN) 50 MG tablet Take 1 tablet (50 mg total) by mouth daily.  30 tablet  11  . Fluticasone-Salmeterol (ADVAIR DISKUS) 500-50 MCG/DOSE AEPB Inhale 1 puff into the lungs 2 (two) times daily.  60 each  4  . furosemide (LASIX) 20 MG tablet Take 1 tablet (20 mg total) by mouth daily.  30 tablet  11  . HYDROcodone-acetaminophen (NORCO/VICODIN) 5-325 MG per tablet Take 1 tablet by mouth every 4 (four) hours as needed for pain.  20 tablet  0  . naproxen (NAPROSYN) 500 MG tablet Take 1 tablet (500 mg total) by mouth 2 (two) times daily with a meal.  120 tablet  5  . omeprazole (PRILOSEC) 20 MG capsule Take 1 capsule (20 mg total) by mouth daily.  30 capsule  11  . traMADol (ULTRAM) 50 MG tablet Take 2 tablets (100 mg total) by mouth every 6 (six) hours as needed for pain.  60 tablet  2    Assessment/Plan

## 2012-10-09 NOTE — Patient Instructions (Addendum)
Your cyst appears to be improving.  We will refer you to Dr. Corliss Skains for further evaluation.  We have made you an appointment for 11/6, at 3:20 pm. -in the meantime, we can prescribe hydrocodone-acetaminophen for pain relief  Please return in 1 week for re-evaluation

## 2012-10-09 NOTE — Assessment & Plan Note (Signed)
Likely represents vaginal yeast infection resulting from antibiotic usage -diflucan 150 mg x1 dose -if symptoms do not resolve, patient to return for pelvic exam with wet prep

## 2012-10-09 NOTE — Assessment & Plan Note (Addendum)
The patient has a history of recurrent pilonidal abscesses, managed by Dr. Corliss Skains, with a history of non-compliance with follow-up visits.  The patient now notes a 1-2 week history of pilonidal abscess, drained 10/22, treated with Clindamycin 300 mg BID 10/23-10/30 with side effects of nausea and vaginal itching/discharge.  The area was examined, and shows no surrounding edema or erythema to suggest cellulitis.  After removing the packing and applying pressure to the area, only serosanguinous discharge was expressed (no purulent discharge).  The area was re-packed. -patient to follow-up with our clinic in 1 week -appointment also made with Dr. Corliss Skains for 11/6 -patient informed to stop clindamycin; she has completed 7 days and is having side effects -patient given hydrocodone for pain

## 2012-10-16 ENCOUNTER — Ambulatory Visit (INDEPENDENT_AMBULATORY_CARE_PROVIDER_SITE_OTHER): Payer: PRIVATE HEALTH INSURANCE | Admitting: Pulmonary Disease

## 2012-10-16 ENCOUNTER — Ambulatory Visit (INDEPENDENT_AMBULATORY_CARE_PROVIDER_SITE_OTHER): Payer: PRIVATE HEALTH INSURANCE | Admitting: Surgery

## 2012-10-16 ENCOUNTER — Encounter: Payer: Self-pay | Admitting: Pulmonary Disease

## 2012-10-16 ENCOUNTER — Encounter (INDEPENDENT_AMBULATORY_CARE_PROVIDER_SITE_OTHER): Payer: Self-pay | Admitting: Surgery

## 2012-10-16 VITALS — BP 138/82 | HR 80 | Temp 97.8°F | Resp 16 | Ht 66.0 in | Wt 283.6 lb

## 2012-10-16 VITALS — BP 110/76 | HR 62 | Temp 98.6°F | Ht 66.0 in | Wt 287.8 lb

## 2012-10-16 DIAGNOSIS — G4730 Sleep apnea, unspecified: Secondary | ICD-10-CM

## 2012-10-16 DIAGNOSIS — L0501 Pilonidal cyst with abscess: Secondary | ICD-10-CM

## 2012-10-16 DIAGNOSIS — J45909 Unspecified asthma, uncomplicated: Secondary | ICD-10-CM

## 2012-10-16 NOTE — Assessment & Plan Note (Signed)
We will start you on CPAP & set up CPAP titration study Given excessive daytime somnolence, narrow pharyngeal exam, witnessed apneas & loud snoring, obstructive sleep apnea is very likely & an overnight polysomnogram will be scheduled as a split study. The pathophysiology of obstructive sleep apnea , it's cardiovascular consequences & modes of treatment including CPAP were discused with the patient in detail & they evidenced understanding.  Weight loss encouraged, compliance with goal of at least 4-6 hrs every night is the expectation. Advised against medications with sedative side effects Cautioned against driving when sleepy - understanding that sleepiness will vary on a day to day basis

## 2012-10-16 NOTE — Progress Notes (Signed)
The patient had a recurrence of her palm abscess. This was drained by family practice. It is almost completely healed. Minimal drainage. The opening is only about 3 mm wide. No surrounding site looks. No tenderness.  Continue with dressing changes until the wound is completely healed. Recheck her in 2 weeks. She may need formal excision if this continues.  Joan Brown. Corliss Skains, MD, [facility]  10/16/2012 1:32 PM

## 2012-10-16 NOTE — Patient Instructions (Signed)
Stay on symbicort I will ask Dr brown to look into your BP medication We will start you on CPAP & set up CPAP titration study Ppl ask your study doctor or nurse to send [place] breathing test & study details

## 2012-10-16 NOTE — Assessment & Plan Note (Signed)
Stay on symbicort I will ask Dr brown to look into your BP medication - atenolol not the best choice for asthmatic Ppl ask your study doctor or nurse to send [place] breathing test & study details

## 2012-10-16 NOTE — Progress Notes (Signed)
  Subjective:    Patient ID: Joan Brown, female    DOB: [birth year], 41 y.o.   MRN: [id number]  HPI 41/F , ex -smoker, obese for FU of recurrent episodic cough/ wheezing being treated as asthma.  She has frequent ER visits. Single mom with 3 little ones (adopting) 05/05/10--She has been evaluated by Dr Donnie Aho who feels she may have inflamed bronchial tubes. Repeated CXrs were nml as were CT angiograms on 04/02/10 & 04/15/10. She reports 'asthma' & bronchitis since childhood . She reports non productive cough without diurnal or seasonal variation.  -started on Symbicort 160/4.23mcg . Spiriva and pulmicort stopped.  PFT showed FEV1 2.20 l/m (76%) , s/p bronchodilator w 13% change.Home sleep study showed mild obstructive sleep apnea - stopped/ slowed breathing 8/h , weight loss advised      10/16/2012 1 y FU 2-3 ER visits/ yr c/o 2-3 asthma attacks x 1 week now. c/o wheezing, chest tx, SOB, cough w/ yellow phlem x1 week. has not used advair x 1 month--needs samples can;t afford RX. Was getting meds from [facility], Now she is enrolled in asthma study over at [employer] - where she was given symbicort PSG 10/13 showed TST 307 mins, AHI 18/h with 50% REM sleep ? Rebound - REM AHI 32/h Started on atenolol for htn Used albuterol nebs last night, did not sleep well  Past Medical History  Diagnosis Date  . COPD (chronic obstructive pulmonary disease)   . Hidradenitis suppurativa   . Asthma     PFT 6/11 FEV1 76%, 13% chagne sp BD.   Marland Kitchen Benign essential hypertension   . Morbid obesity   . Chest pain   . Obstructive sleep apnea   . Arthritis   . Osteoporosis      Review of Systems neg for any significant sore throat, dysphagia, itching, sneezing, nasal congestion or excess/ purulent secretions, fever, chills, sweats, unintended wt loss, pleuritic or exertional cp, hempoptysis, orthopnea pnd or change in chronic leg swelling. Also denies presyncope, palpitations, heartburn, abdominal pain,  nausea, vomiting, diarrhea or change in bowel or urinary habits, dysuria,hematuria, rash, arthralgias, visual complaints, headache, numbness weakness or ataxia.     Objective:   Physical Exam  Gen. Pleasant, obese, in no distress, normal affect ENT - no lesions, no post nasal drip, class 2-3 airway Neck: No JVD, no thyromegaly, no carotid bruits Lungs: no use of accessory muscles, no dullness to percussion, decreased without rales or rhonchi  Cardiovascular: Rhythm regular, heart sounds  normal, no murmurs or gallops, no peripheral edema Abdomen: soft and non-tender, no hepatosplenomegaly, BS normal. Musculoskeletal: No deformities, no cyanosis or clubbing Neuro:  alert, non focal, no tremors       Assessment & Plan:

## 2012-10-21 ENCOUNTER — Encounter: Payer: Self-pay | Admitting: Internal Medicine

## 2012-10-25 ENCOUNTER — Ambulatory Visit (HOSPITAL_BASED_OUTPATIENT_CLINIC_OR_DEPARTMENT_OTHER): Payer: Medicaid Other | Attending: Pulmonary Disease

## 2012-10-25 VITALS — Ht 65.0 in | Wt 272.0 lb

## 2012-10-25 DIAGNOSIS — G4733 Obstructive sleep apnea (adult) (pediatric): Secondary | ICD-10-CM | POA: Insufficient documentation

## 2012-10-25 DIAGNOSIS — J45909 Unspecified asthma, uncomplicated: Secondary | ICD-10-CM | POA: Insufficient documentation

## 2012-10-25 DIAGNOSIS — G4730 Sleep apnea, unspecified: Secondary | ICD-10-CM

## 2012-10-25 DIAGNOSIS — Z9989 Dependence on other enabling machines and devices: Secondary | ICD-10-CM

## 2012-10-28 DIAGNOSIS — M810 Age-related osteoporosis without current pathological fracture: Secondary | ICD-10-CM | POA: Insufficient documentation

## 2012-10-28 DIAGNOSIS — J441 Chronic obstructive pulmonary disease with (acute) exacerbation: Secondary | ICD-10-CM | POA: Insufficient documentation

## 2012-10-28 DIAGNOSIS — Z8739 Personal history of other diseases of the musculoskeletal system and connective tissue: Secondary | ICD-10-CM | POA: Insufficient documentation

## 2012-10-28 DIAGNOSIS — Z87891 Personal history of nicotine dependence: Secondary | ICD-10-CM | POA: Insufficient documentation

## 2012-10-28 DIAGNOSIS — J45909 Unspecified asthma, uncomplicated: Secondary | ICD-10-CM | POA: Insufficient documentation

## 2012-10-28 DIAGNOSIS — I10 Essential (primary) hypertension: Secondary | ICD-10-CM | POA: Insufficient documentation

## 2012-10-28 DIAGNOSIS — G4733 Obstructive sleep apnea (adult) (pediatric): Secondary | ICD-10-CM | POA: Insufficient documentation

## 2012-10-29 ENCOUNTER — Emergency Department (HOSPITAL_COMMUNITY)
Admit: 2012-10-29 | Discharge: 2012-10-29 | Disposition: A | Discharge status: Home / Self Care | Payer: Medicaid Other | Attending: Emergency Medicine | Admitting: Emergency Medicine

## 2012-10-29 ENCOUNTER — Telehealth: Payer: Self-pay | Admitting: Pulmonary Disease

## 2012-10-29 ENCOUNTER — Encounter (HOSPITAL_COMMUNITY): Payer: Self-pay | Admitting: Adult Health

## 2012-10-29 ENCOUNTER — Emergency Department (HOSPITAL_COMMUNITY)
Admission: EM | Admit: 2012-10-29 | Discharge: 2012-10-29 | Disposition: A | Discharge status: Home Health | Payer: Medicaid Other | Attending: Emergency Medicine | Admitting: Emergency Medicine

## 2012-10-29 DIAGNOSIS — G4710 Hypersomnia, unspecified: Secondary | ICD-10-CM

## 2012-10-29 DIAGNOSIS — J441 Chronic obstructive pulmonary disease with (acute) exacerbation: Secondary | ICD-10-CM

## 2012-10-29 DIAGNOSIS — J40 Bronchitis, not specified as acute or chronic: Secondary | ICD-10-CM

## 2012-10-29 DIAGNOSIS — G4733 Obstructive sleep apnea (adult) (pediatric): Secondary | ICD-10-CM

## 2012-10-29 DIAGNOSIS — G4730 Sleep apnea, unspecified: Secondary | ICD-10-CM

## 2012-10-29 LAB — BASIC METABOLIC PANEL
BUN: 13 mg/dL (ref 6–23)
CO2: 27 mEq/L (ref 19–32)
Chloride: 104 mEq/L (ref 96–112)
GFR calc non Af Amer: 77 mL/min — ABNORMAL LOW (ref >90)
Glucose, Bld: 101 mg/dL — ABNORMAL HIGH (ref 70–99)
Potassium: 3.4 mEq/L — ABNORMAL LOW (ref 3.5–5.1)
Sodium: 140 mEq/L (ref 135–145)

## 2012-10-29 LAB — CBC
HCT: 37.8 % (ref 36.0–46.0)
Hemoglobin: 12.1 g/dL (ref 12.0–15.0)
MCH: 26.7 pg (ref 26.0–34.0)
MCHC: 32 g/dL (ref 30.0–36.0)
MCV: 83.4 fL (ref 78.0–100.0)
RBC: 4.53 MIL/uL (ref 3.87–5.11)

## 2012-10-29 MED ORDER — AZITHROMYCIN 250 MG PO TABS: ORAL_TABLET | ORAL | Status: DC

## 2012-10-29 MED ORDER — IPRATROPIUM BROMIDE 0.02 % IN SOLN
0.5000 mg | RESPIRATORY_TRACT | Status: AC
  Administered 2012-10-29: 0.5 mg via RESPIRATORY_TRACT
  Filled 2012-10-29: qty 2.5

## 2012-10-29 MED ORDER — PREDNISONE 20 MG PO TABS: 60.0000 mg | ORAL_TABLET | Freq: Every day | ORAL | Status: DC

## 2012-10-29 MED ORDER — PREDNISONE 20 MG PO TABS
60.0000 mg | ORAL_TABLET | Freq: Once | ORAL | Status: AC
  Administered 2012-10-29: 60 mg via ORAL
  Filled 2012-10-29: qty 3

## 2012-10-29 MED ORDER — ALBUTEROL SULFATE (5 MG/ML) 0.5% IN NEBU
5.0000 mg | INHALATION_SOLUTION | RESPIRATORY_TRACT | Status: AC
  Administered 2012-10-29: 5 mg via RESPIRATORY_TRACT
  Filled 2012-10-29: qty 1

## 2012-10-29 NOTE — ED Provider Notes (Addendum)
History     CSN: [id number]  Arrival date & time 10/28/12  2346   First MD Initiated Contact with Patient 10/29/12 0142      Chief Complaint  Patient presents with  . Shortness of Breath    (Consider location/radiation/quality/duration/timing/severity/associated sxs/prior treatment) The history is provided by the patient.  Joan Brown is a 41 y.o. female hx of COPD, HTN, OSA here with SOB, cough. Productive cough with greenish yellowish sputum for 3 days. Worse SOB with coughing. No fevers or chills. Tried taking prednisone 20mg  and albuterol without improvement of symptoms. Denies audible wheezing. No abdominal pain or vomiting. Had one previous admission for COPD in the past and no intubations.    Past Medical History  Diagnosis Date  . COPD (chronic obstructive pulmonary disease)   . Hidradenitis suppurativa   . Asthma     PFT 6/11 FEV1 76%, 13% chagne sp BD.   Marland Kitchen Benign essential hypertension   . Morbid obesity   . Chest pain   . Obstructive sleep apnea   . Arthritis   . Osteoporosis     History reviewed. No pertinent past surgical history.  Family History  Problem Relation Age of Onset  . Alcohol abuse Sister   . Asthma Other   . Diabetes Mother   . Hypertension Mother   . Diabetes Father   . Hypertension Father   . Heart disease Father   . Cancer Paternal Aunt     skin  . Cancer Paternal Uncle     skin  . Cancer Maternal Grandmother     pancreatic  . Stroke Paternal Grandmother     History  Substance Use Topics  . Smoking status: Former Smoker -- 20 years    Types: Cigarettes    Quit date: 12/11/2009  . Smokeless tobacco: Never Used  . Alcohol Use: Yes     Comment: occasionally    OB History    Grav Para Term Preterm Abortions TAB SAB Ect Mult Living                  Review of Systems  Respiratory: Positive for cough and shortness of breath.   All other systems reviewed and are negative.    Allergies  Aspirin  Home Medications    Current Outpatient Rx  Name  Route  Sig  Dispense  Refill  . ALBUTEROL SULFATE HFA 108 (90 BASE) MCG/ACT IN AERS   Inhalation   Inhale 2 puffs into the lungs every 4 (four) hours as needed. FOR SHORTNESS OF BREATH   4 Inhaler   3   . ALBUTEROL SULFATE (2.5 MG/3ML) 0.083% IN NEBU   Nebulization   Take 3 mLs (2.5 mg total) by nebulization every 6 (six) hours as needed. FOR SHORTNESS OF BREATH   75 mL   4   . ATENOLOL 50 MG PO TABS   Oral   Take 1 tablet (50 mg total) by mouth daily.   30 tablet   11   . FLUTICASONE-SALMETEROL 500-50 MCG/DOSE IN AEPB   Inhalation   Inhale 1 puff into the lungs 2 (two) times daily.   60 each   4   . FUROSEMIDE 20 MG PO TABS   Oral   Take 1 tablet (20 mg total) by mouth daily.   30 tablet   11   . NAPROXEN 500 MG PO TABS   Oral   Take 1 tablet (500 mg total) by mouth 2 (two) times daily with a meal.  120 tablet   5   . OMEPRAZOLE 20 MG PO CPDR   Oral   Take 1 capsule (20 mg total) by mouth daily.   30 capsule   11   . PRESCRIPTION MEDICATION   Oral   Take 1 tablet by mouth daily. Fluid pill         . PRESCRIPTION MEDICATION   Oral   Take 1 tablet by mouth daily. Blood pressure medicaiton           BP 112/66  Pulse 74  Temp 98.4 F (36.9 C) (Oral)  Resp 18  SpO2 100%  Physical Exam  Nursing note and vitals reviewed. Constitutional: She is oriented to person, place, and time. She appears well-developed and well-nourished.       Coughing   HENT:  Head: Normocephalic.  Mouth/Throat: Oropharynx is clear and moist.  Eyes: Conjunctivae normal are normal. Pupils are equal, round, and reactive to light.  Neck: Normal range of motion. Neck supple.  Cardiovascular: Normal rate, regular rhythm and normal heart sounds.   Pulmonary/Chest:       Mildly tachypneic. Coughing. Decreased breath sounds throughout. No wheezing or crackles.   Abdominal: Soft. Bowel sounds are normal.  Musculoskeletal: Normal range of motion.  She exhibits no edema and no tenderness.  Neurological: She is alert and oriented to person, place, and time.  Skin: Skin is warm and dry.  Psychiatric: She has a normal mood and affect. Her behavior is normal. Judgment and thought content normal.    ED Course  Procedures (including critical care time)  Labs Reviewed  BASIC METABOLIC PANEL - Abnormal; Notable for the following:    Potassium 3.4 (*)     Glucose, Bld 101 (*)     GFR calc non Af Amer 77 (*)     GFR calc Af Amer 89 (*)     All other components within normal limits  CBC - Abnormal; Notable for the following:    WBC 10.9 (*)     All other components within normal limits  POCT I-STAT TROPONIN I   Dg Chest 2 View (if Patient Has Fever And/or Copd)  10/29/2012  *RADIOLOGY REPORT*  Clinical Data: Shortness of breath.  CHEST - 2 VIEW  Comparison: 01/09/2012  Findings:  No confluent airspace opacity.  No pleural effusion or pneumothorax.  Cardiomediastinal contours within normal range.  No acute osseous finding.  IMPRESSION: No radiographic evidence of acute cardiopulmonary process.   Original Report Authenticated By: Jearld Lesch, M.D.      No diagnosis found.    MDM  Joan Brown is a 41 y.o. female here with SOB, cough. Likely COPD exacerbation and bronchitis. Will give steroids, albuterol. Will d/c home on Zpack.    5:14 AM Patient comfortable, never hypoxic. Labs nl, CXR nl. Felt better, d/c home      Richardean Canal, MD 10/29/12 4540  Richardean Canal, MD 10/29/12 [phone]

## 2012-10-29 NOTE — Telephone Encounter (Signed)
CPAP titration showed CPAP 14 cm, med FF mask. Order sent to DME

## 2012-10-29 NOTE — ED Notes (Addendum)
Presents with SOB that began this afternoon after coughing. C/o left sided chest tightness that is worse with deep inspiration and goes into back and flank.  Has productive cough described as yellow. tightnessis associated with dizziness and lightheadedness. Tried nebulizer, rescue inhaler, prednisone and symbicort at home with no relief.

## 2012-10-30 NOTE — Procedures (Signed)
NAME:  Joan Brown, Joan Brown NO.:  [id number]  MEDICAL RECORD NO.:  [id number]          PATIENT TYPE:  OUT  LOCATION:  SLEEP CENTER                 FACILITY:  [facility]  PHYSICIAN:  Oretha Milch, MD      DATE OF BIRTH:  18-Jun-1971  DATE OF STUDY:  10/25/2012                           NOCTURNAL POLYSOMNOGRAM  REFERRING PHYSICIAN:  Oretha Milch, MD  INDICATION FOR STUDY:  Mr. Zanella is a 41 year old woman with asthma and moderate obstructive sleep apnea.  Polysomnogram in October 2013 showed an AHI of 18 per hour with 50% of REM sleep.  In November, related AHI of 33 events per hour.  At the time of this study, she weighed 272 pounds with a height of 5 feet 5 inches, BMI of 47, neck size of 15 inches.  EPWORTH SLEEPINESS SCORE:  19.  MEDICATIONS:  Furosemide, atenolol, omeprazole, Symbicort, albuterol, and naproxen.  This CPAP titration study was performed with a sleep technologist in attendance.  EEG, EOG, EMG, EKG, and respiratory parameters were recorded.  Sleep stages, arousals, limb movements, and respiratory data were scored according to criteria laid out by the American Academy of Sleep Medicine.  SLEEP ARCHITECTURE:  Lights out was at 10:49 pm.  Lights on was at 4:45 am.  Total sleep time was 312 minutes with a sleep period time of 352 minutes and sleep efficiency of 88%.  Sleep latency was 2.5 minutes with a latency to REM sleep of 55 minutes and awake after sleep onset of 41 minute.  Sleep stages as a percentage of total sleep time was N1 6%, N2 61%, N3 0%, and REM 33% (104 minutes).  Supine sleep was not noted. Longest period of REM sleep was around 1:30 am.  AROUSAL DATA:  There were total of 25 arousals with an arousal index of 4 events per hour.  Of these, 21 were spontaneous, and the rest were associated with respiratory events.  RESPIRATORY DATA:  CPAP was initiated at 5 cm with a medium fullface mask and titrated to 11 cm to eliminate the events at  14 cm to eliminate snoring.  At a level of 9 cm for 154 minutes including 90 minutes of REM sleep, 2 hypopneas were noted.  The lowest desaturation of 90%.  At a final level of 14 cm for 4.5 minutes, no events or desaturations were noted.  This appears to be the optimal level used during the study.  LIMB MOVEMENT DATA:  No significant limb movements were noted.  OXYGEN SATURATION DATA:  The desaturation index was 2.5 events per hour. The lowest desaturation was 90%.  CARDIA DATA:  The low heart rate was 35 beats per minute.  The high heart rate recorded was an artifact.  No arrhythmias were noted.  DISCUSSION:  She was desensitized with a medium fullface mask.  CPAP pressure was increased mainly for snoring.  Titration appears to be the optimal.  IMPRESSION: 1. Moderate obstructive sleep apnea with hypopneas causing sleep     fragmentation and oxygen desaturation. 2. This was corrected by CPAP of 14 cm, which also eliminated snoring     with a medium fullface mask. 3.  No evidence of cardiac arrhythmias, limb movements, or behavioral     disturbance during sleep.  RECOMMENDATION: 1. CPAP should be initiated at 14 cm with a medium fullface mask and     humidity.  Compliance should be monitored at this level. 2. Weight loss should be encouraged. 3. She should be asked to avoid medications with sedative side     effects.  She should be cautioned against driving when sleepy.     Oretha Milch, MD    RVA/MEDQ  D:  10/29/2012 13:53:09  T:  10/30/2012 07:47:23  Job:  [id number]

## 2012-11-06 ENCOUNTER — Encounter (INDEPENDENT_AMBULATORY_CARE_PROVIDER_SITE_OTHER): Payer: Self-pay | Admitting: Surgery

## 2012-11-06 ENCOUNTER — Ambulatory Visit (INDEPENDENT_AMBULATORY_CARE_PROVIDER_SITE_OTHER): Payer: PRIVATE HEALTH INSURANCE | Admitting: Surgery

## 2012-11-06 VITALS — BP 124/78 | HR 89 | Temp 97.2°F | Ht 66.0 in | Wt 283.0 lb

## 2012-11-06 DIAGNOSIS — L0501 Pilonidal cyst with abscess: Secondary | ICD-10-CM

## 2012-11-06 NOTE — Progress Notes (Signed)
The pilonidal abscess is completely healed. No palpable mass. The incision is completely healed. No tenderness. We will see her back as needed.  Wilmon Arms. Corliss Skains, MD, [facility]  11/06/2012 2:37 PM

## 2012-11-08 ENCOUNTER — Encounter (HOSPITAL_BASED_OUTPATIENT_CLINIC_OR_DEPARTMENT_OTHER): Payer: PRIVATE HEALTH INSURANCE

## 2012-11-15 ENCOUNTER — Encounter (HOSPITAL_BASED_OUTPATIENT_CLINIC_OR_DEPARTMENT_OTHER): Payer: PRIVATE HEALTH INSURANCE

## 2012-11-21 ENCOUNTER — Encounter: Payer: Self-pay | Admitting: Internal Medicine

## 2012-11-21 DIAGNOSIS — F329 Major depressive disorder, single episode, unspecified: Secondary | ICD-10-CM | POA: Insufficient documentation

## 2012-11-29 ENCOUNTER — Encounter: Payer: Self-pay | Admitting: Pulmonary Disease

## 2012-11-29 ENCOUNTER — Ambulatory Visit (INDEPENDENT_AMBULATORY_CARE_PROVIDER_SITE_OTHER): Payer: No Typology Code available for payment source | Admitting: Pulmonary Disease

## 2012-11-29 VITALS — BP 134/84 | HR 77 | Temp 99.0°F | Ht 66.0 in | Wt 286.4 lb

## 2012-11-29 DIAGNOSIS — G4730 Sleep apnea, unspecified: Secondary | ICD-10-CM

## 2012-11-29 DIAGNOSIS — J45909 Unspecified asthma, uncomplicated: Secondary | ICD-10-CM

## 2012-11-29 MED ORDER — MOMETASONE FURO-FORMOTEROL FUM 200-5 MCG/ACT IN AERO: 2.0000 | INHALATION_SPRAY | Freq: Two times a day (BID) | RESPIRATORY_TRACT | Status: DC

## 2012-11-29 NOTE — Assessment & Plan Note (Signed)
CPAP 14 cm is very effective -use it at least 6 hrs every night Increase to 15 cm for snoring  Weight loss encouraged, compliance with goal of at least 4-6 hrs every night is the expectation. Advised against medications with sedative side effects Cautioned against driving when sleepy - understanding that sleepiness will vary on a day to day basis

## 2012-11-29 NOTE — Progress Notes (Signed)
  Subjective:    Patient ID: Joan Brown, female    DOB: 1970-12-20, 41 y.o.   MRN: [id number]  HPI  41/F , ex -smoker, obese for FU of recurrent episodic cough/ wheezing being treated as asthma.  She has frequent ER visits. Single mom with 3 little ones (adopting) -2-3 ER visits/ yr  05/05/10--evaluated by Dr Donnie Aho. Repeated CXrs were nml as were CT angiograms on 04/02/10 & 04/15/10. She reports 'asthma' & bronchitis since childhood . She reports non productive cough without diurnal or seasonal variation.  -started on Symbicort 160/4.29mcg . Spiriva and pulmicort stopped.  PFT showed FEV1 2.20 l/m (76%) , s/p bronchodilator w 13% change. Home sleep study showed mild obstructive sleep apnea - AHI 8/h PSG 10/13 showed TST 307 mins, AHI 18/h with 50% REM sleep ? Rebound - REM AHI 32/h  11/29/2012 Was getting meds from [facility], Now she is enrolled in asthma study over at [employer] - where she was given symbicort   c/o hacking cough x 1 month, wheezing, chest tx, increase SOB w/ exertion, HA, increase in BP wears cpap everynight x 7-8 hrs a night. mask is too big.  CPAP titration showed CPAP 14 cm, med FF mask. Download 12/13 shows no residuals on 14 cm, leak ok, good usage, c/o snoring  Needs more air Eyes puffy in am, mask too bulky Out of study now,  on atenolol for htn  Gags when laughing or during naps sometimes  Review of Systems neg for any significant sore throat, dysphagia, itching, sneezing, nasal congestion or excess/ purulent secretions, fever, chills, sweats, unintended wt loss, pleuritic or exertional cp, hempoptysis, orthopnea pnd or change in chronic leg swelling. Also denies presyncope, palpitations, heartburn, abdominal pain, nausea, vomiting, diarrhea or change in bowel or urinary habits, dysuria,hematuria, rash, arthralgias, visual complaints, headache, numbness weakness or ataxia.     Objective:   Physical Exam  Gen. Pleasant, obese, in no distress ENT - no  lesions, no post nasal drip Neck: No JVD, no thyromegaly, no carotid bruits Lungs: no use of accessory muscles, no dullness to percussion, decreased without rales or rhonchi  Cardiovascular: Rhythm regular, heart sounds  normal, no murmurs or gallops, no peripheral edema Musculoskeletal: No deformities, no cyanosis or clubbing , no tremors       Assessment & Plan:

## 2012-11-29 NOTE — Patient Instructions (Signed)
Trial of Dulera  1 puff bid -This is your maintenance inhaler- If this works we wil send in RX to [facility] for you to pick up with 3 refills Use rescue inhaler as needed only for episodes Stay on reflux medication CPAP 14 cm is very effective -use it at least 6 hrs every night

## 2012-11-29 NOTE — Assessment & Plan Note (Signed)
Trial of Dulera  1 puff bid -This is your maintenance inhaler- If this works we wil send in RX to [facility] for you to pick up with 3 refills Use rescue inhaler as needed only for episodes Stay on reflux medication

## 2012-12-27 ENCOUNTER — Encounter: Payer: Self-pay | Admitting: Pulmonary Disease

## 2013-01-16 ENCOUNTER — Emergency Department (HOSPITAL_COMMUNITY): Payer: Medicaid Other

## 2013-01-16 ENCOUNTER — Emergency Department (HOSPITAL_COMMUNITY)
Admission: EM | Admit: 2013-01-16 | Discharge: 2013-01-16 | Disposition: A | Discharge status: Home / Self Care | Payer: Medicaid Other | Attending: Emergency Medicine | Admitting: Emergency Medicine

## 2013-01-16 ENCOUNTER — Encounter (HOSPITAL_COMMUNITY): Payer: Self-pay | Admitting: Cardiology

## 2013-01-16 DIAGNOSIS — J019 Acute sinusitis, unspecified: Secondary | ICD-10-CM | POA: Insufficient documentation

## 2013-01-16 DIAGNOSIS — J329 Chronic sinusitis, unspecified: Secondary | ICD-10-CM

## 2013-01-16 DIAGNOSIS — Z79899 Other long term (current) drug therapy: Secondary | ICD-10-CM | POA: Insufficient documentation

## 2013-01-16 DIAGNOSIS — Z872 Personal history of diseases of the skin and subcutaneous tissue: Secondary | ICD-10-CM | POA: Insufficient documentation

## 2013-01-16 DIAGNOSIS — G4733 Obstructive sleep apnea (adult) (pediatric): Secondary | ICD-10-CM | POA: Insufficient documentation

## 2013-01-16 DIAGNOSIS — J440 Chronic obstructive pulmonary disease with acute lower respiratory infection: Secondary | ICD-10-CM | POA: Insufficient documentation

## 2013-01-16 DIAGNOSIS — R51 Headache: Secondary | ICD-10-CM | POA: Insufficient documentation

## 2013-01-16 DIAGNOSIS — I10 Essential (primary) hypertension: Secondary | ICD-10-CM | POA: Insufficient documentation

## 2013-01-16 DIAGNOSIS — J40 Bronchitis, not specified as acute or chronic: Secondary | ICD-10-CM

## 2013-01-16 DIAGNOSIS — Z791 Long term (current) use of non-steroidal anti-inflammatories (NSAID): Secondary | ICD-10-CM | POA: Insufficient documentation

## 2013-01-16 DIAGNOSIS — J029 Acute pharyngitis, unspecified: Secondary | ICD-10-CM | POA: Insufficient documentation

## 2013-01-16 DIAGNOSIS — R509 Fever, unspecified: Secondary | ICD-10-CM | POA: Insufficient documentation

## 2013-01-16 DIAGNOSIS — Z8709 Personal history of other diseases of the respiratory system: Secondary | ICD-10-CM | POA: Insufficient documentation

## 2013-01-16 DIAGNOSIS — J3489 Other specified disorders of nose and nasal sinuses: Secondary | ICD-10-CM | POA: Insufficient documentation

## 2013-01-16 DIAGNOSIS — Z8739 Personal history of other diseases of the musculoskeletal system and connective tissue: Secondary | ICD-10-CM | POA: Insufficient documentation

## 2013-01-16 DIAGNOSIS — M810 Age-related osteoporosis without current pathological fracture: Secondary | ICD-10-CM | POA: Insufficient documentation

## 2013-01-16 DIAGNOSIS — IMO0001 Reserved for inherently not codable concepts without codable children: Secondary | ICD-10-CM | POA: Insufficient documentation

## 2013-01-16 DIAGNOSIS — J209 Acute bronchitis, unspecified: Secondary | ICD-10-CM | POA: Insufficient documentation

## 2013-01-16 DIAGNOSIS — Z87891 Personal history of nicotine dependence: Secondary | ICD-10-CM | POA: Insufficient documentation

## 2013-01-16 MED ORDER — DOXYCYCLINE HYCLATE 100 MG PO CAPS: 100.0000 mg | ORAL_CAPSULE | Freq: Two times a day (BID) | ORAL | Status: DC

## 2013-01-16 MED ORDER — PREDNISONE 10 MG PO TABS: ORAL_TABLET | ORAL | Status: DC

## 2013-01-16 MED ORDER — HYDROCOD POLST-CHLORPHEN POLST 10-8 MG/5ML PO LQCR
5.0000 mL | Freq: Two times a day (BID) | ORAL | Status: DC | PRN
Start: 2013-01-16 — End: 2013-07-22

## 2013-01-16 MED ORDER — DOXYCYCLINE HYCLATE 100 MG PO TABS
100.0000 mg | ORAL_TABLET | Freq: Once | ORAL | Status: AC
  Administered 2013-01-16: 100 mg via ORAL
  Filled 2013-01-16: qty 1

## 2013-01-16 MED ORDER — HYDROCOD POLST-CHLORPHEN POLST 10-8 MG/5ML PO LQCR
5.0000 mL | Freq: Once | ORAL | Status: AC
  Administered 2013-01-16: 5 mL via ORAL
  Filled 2013-01-16: qty 5

## 2013-01-16 MED ORDER — PREDNISONE 20 MG PO TABS
60.0000 mg | ORAL_TABLET | Freq: Once | ORAL | Status: AC
Start: 2013-01-16 — End: 2013-01-16
  Administered 2013-01-16: 60 mg via ORAL
  Filled 2013-01-16: qty 3

## 2013-01-16 NOTE — ED Notes (Signed)
Pt reports cough and congestion for the past 2 days. Denies any fever at this time. Reports chest congestion. Tried OTC medications without any relief.

## 2013-01-16 NOTE — ED Provider Notes (Signed)
History     CSN: [id number]  Arrival date & time 01/16/13  1105   First MD Initiated Contact with Patient 01/16/13 1231      Chief Complaint  Patient presents with  . Cough  . Nasal Congestion    (Consider location/radiation/quality/duration/timing/severity/associated sxs/prior treatment) Patient is a 42 y.o. female presenting with cough. The history is provided by the patient.  Cough This is a new problem. The current episode started more than 2 days ago. The problem occurs every few minutes. The problem has been gradually worsening. The cough is productive of sputum. The maximum temperature recorded prior to her arrival was 101 to 101.9 F. Associated symptoms include chills, headaches, rhinorrhea, sore throat and myalgias. Pertinent negatives include no chest pain, no shortness of breath and no wheezing. She has tried cough syrup for the symptoms. The treatment provided no relief. She is not a smoker. Her past medical history is significant for bronchitis, COPD and asthma. Her past medical history does not include pneumonia.    Past Medical History  Diagnosis Date  . COPD (chronic obstructive pulmonary disease)   . Hidradenitis suppurativa   . Asthma     PFT 6/11 FEV1 76%, 13% chagne sp BD.   Marland Kitchen Benign essential hypertension   . Morbid obesity   . Chest pain   . Obstructive sleep apnea   . Arthritis   . Osteoporosis     History reviewed. No pertinent past surgical history.  Family History  Problem Relation Age of Onset  . Alcohol abuse Sister   . Asthma Other   . Diabetes Mother   . Hypertension Mother   . Diabetes Father   . Hypertension Father   . Heart disease Father   . Cancer Paternal Aunt     skin  . Cancer Paternal Uncle     skin  . Cancer Maternal Grandmother     pancreatic  . Stroke Paternal Grandmother     History  Substance Use Topics  . Smoking status: Former Smoker -- 1.0 packs/day for 20 years    Types: Cigarettes    Quit date: 12/11/2009  .  Smokeless tobacco: Never Used  . Alcohol Use: Yes     Comment: occasionally    OB History    Grav Para Term Preterm Abortions TAB SAB Ect Mult Living                  Review of Systems  Constitutional: Positive for chills. Negative for activity change.       All ROS Neg except as noted in HPI  HENT: Positive for sore throat and rhinorrhea. Negative for nosebleeds and neck pain.   Eyes: Negative for photophobia and discharge.  Respiratory: Positive for cough. Negative for shortness of breath and wheezing.   Cardiovascular: Negative for chest pain and palpitations.  Gastrointestinal: Negative for abdominal pain and blood in stool.  Genitourinary: Negative for dysuria, frequency and hematuria.  Musculoskeletal: Positive for myalgias. Negative for back pain and arthralgias.  Skin: Negative.   Neurological: Positive for headaches. Negative for dizziness, seizures and speech difficulty.  Psychiatric/Behavioral: Negative for hallucinations and confusion.    Allergies  Aspirin  Home Medications   Current Outpatient Rx  Name  Route  Sig  Dispense  Refill  . ALBUTEROL SULFATE HFA 108 (90 BASE) MCG/ACT IN AERS   Inhalation   Inhale 2 puffs into the lungs every 4 (four) hours as needed. FOR SHORTNESS OF BREATH   4 Inhaler  3   . ALBUTEROL SULFATE (2.5 MG/3ML) 0.083% IN NEBU   Nebulization   Take 3 mLs (2.5 mg total) by nebulization every 6 (six) hours as needed. FOR SHORTNESS OF BREATH   75 mL   4   . ATENOLOL 50 MG PO TABS   Oral   Take 1 tablet (50 mg total) by mouth daily.   30 tablet   11   . BUSPIRONE HCL 10 MG PO TABS   Oral   Take 10 mg by mouth 2 (two) times daily.         . FUROSEMIDE 20 MG PO TABS   Oral   Take 1 tablet (20 mg total) by mouth daily.   30 tablet   11   . MOMETASONE FURO-FORMOTEROL FUM 200-5 MCG/ACT IN AERO   Inhalation   Inhale 2 puffs into the lungs 2 (two) times daily.   1 Inhaler   0   . NAPROXEN 500 MG PO TABS   Oral   Take 1  tablet (500 mg total) by mouth 2 (two) times daily with a meal.   120 tablet   5   . OMEPRAZOLE 20 MG PO CPDR   Oral   Take 1 capsule (20 mg total) by mouth daily.   30 capsule   11   . SERTRALINE HCL 50 MG PO TABS   Oral   Take 50 mg by mouth daily.           BP 138/94  Pulse 84  Temp 98.4 F (36.9 C) (Oral)  Resp 18  SpO2 97%  LMP 01/16/2013  Physical Exam  Nursing note and vitals reviewed. Constitutional: She is oriented to person, place, and time. She appears well-developed and well-nourished.  Non-toxic appearance.  HENT:  Head: Normocephalic.  Right Ear: Tympanic membrane and external ear normal.  Left Ear: Tympanic membrane and external ear normal.       Nasal congestion present.  Eyes: EOM and lids are normal. Pupils are equal, round, and reactive to light.  Neck: Normal range of motion. Neck supple. Carotid bruit is not present.  Cardiovascular: Normal rate, regular rhythm, normal heart sounds, intact distal pulses and normal pulses.   Pulmonary/Chest: Breath sounds normal. No respiratory distress.       Course breath sounds. Symmetrical rise and fall of the chest. No wheezes. Pt speaks in complete sentences. Chest wall soreness.  Abdominal: Soft. Bowel sounds are normal. There is no tenderness. There is no guarding.  Musculoskeletal: Normal range of motion.  Lymphadenopathy:       Head (right side): No submandibular adenopathy present.       Head (left side): No submandibular adenopathy present.    She has no cervical adenopathy.  Neurological: She is alert and oriented to person, place, and time. She has normal strength. No cranial nerve deficit or sensory deficit.  Skin: Skin is warm and dry.  Psychiatric: She has a normal mood and affect. Her speech is normal.    ED Course  Procedures (including critical care time)  Labs Reviewed - No data to display Dg Chest 2 View  01/16/2013  *RADIOLOGY REPORT*  Clinical Data: Cough and nasal congestion.   Hypertension and COPD. Ex-smoker.  Shortness of breath.  CHEST - 2 VIEW  Comparison: 10/29/2012  Findings: Lateral view degraded by patient arm position.  Midline trachea.  Normal heart size and mediastinal contours. No pleural effusion or pneumothorax.  Clear lungs.  Right glenohumeral joint osteoarthritis.  IMPRESSION: No  acute cardiopulmonary disease.   Original Report Authenticated By: Jeronimo Greaves, M.D.      No diagnosis found.    MDM  I have reviewed nursing notes, vital signs, and all appropriate lab and imaging results for this patient. Chest xray is negative for pneumonia or acute problem. Pt treated with prednisone, doxycycline and tussionex for bronchitis and sinusitis. Pt has had a flu shot, but I have informed her that she could have some URI/Flu type illness, and given her influenza precautions. Pt has albuterol inhaler at home. Pt to return to ED if any changes or problem.       Kathie Dike, PA 01/17/13 0847  Kathie Dike, PA 01/17/13 [phone]

## 2013-01-20 NOTE — ED Provider Notes (Signed)
Medical screening examination/treatment/procedure(s) were performed by non-physician practitioner and as supervising physician I was immediately available for consultation/collaboration.   Mariya Mottley E Shemica Meath, MD 01/20/13 0753 

## 2013-01-21 ENCOUNTER — Ambulatory Visit (INDEPENDENT_AMBULATORY_CARE_PROVIDER_SITE_OTHER): Payer: No Typology Code available for payment source | Admitting: Internal Medicine

## 2013-01-21 ENCOUNTER — Encounter: Payer: Self-pay | Admitting: Internal Medicine

## 2013-01-21 VITALS — BP 153/98 | HR 71 | Temp 97.7°F | Wt 292.0 lb

## 2013-01-21 DIAGNOSIS — I10 Essential (primary) hypertension: Secondary | ICD-10-CM

## 2013-01-21 DIAGNOSIS — F329 Major depressive disorder, single episode, unspecified: Secondary | ICD-10-CM

## 2013-01-21 DIAGNOSIS — M25569 Pain in unspecified knee: Secondary | ICD-10-CM

## 2013-01-21 LAB — BASIC METABOLIC PANEL WITH GFR
BUN: 9 mg/dL (ref 6–23)
CO2: 26 mEq/L (ref 19–32)
Chloride: 104 mEq/L (ref 96–112)
Creat: 0.88 mg/dL (ref 0.50–1.10)
GFR, Est Non African American: 80 mL/min
Glucose, Bld: 79 mg/dL (ref 70–99)
Potassium: 4 mEq/L (ref 3.5–5.3)

## 2013-01-21 MED ORDER — DICLOFENAC SODIUM 1 % TD GEL: 2.0000 g | Freq: Four times a day (QID) | TRANSDERMAL | Status: DC

## 2013-01-21 MED ORDER — BUPROPION HCL ER (XL) 150 MG PO TB24: 150.0000 mg | ORAL_TABLET | ORAL | Status: DC

## 2013-01-21 MED ORDER — LISINOPRIL-HYDROCHLOROTHIAZIDE 20-12.5 MG PO TABS: 1.0000 | ORAL_TABLET | Freq: Every day | ORAL | Status: DC

## 2013-01-21 NOTE — Assessment & Plan Note (Signed)
Well controlled but she is not happy with weight gain. -Will stop Zoloft -Try Wellbutrin 150mg  XL daily -Reassess in 4 weeks

## 2013-01-21 NOTE — Progress Notes (Signed)
Patient ID: Joan Brown, female   DOB: [birth year], 42 y.o.   MRN: [id number]  HPI" Joan Brown is a 42 yo woman presents today for leg pains x several years. Worse in the past 2 weeks worse on left knee. Tooth ache throbbing. Knee pain on both sides but feels pain in her upper thigh as well.  She tried Naproxen, aleve,  She tried to loose weight but it fluctuates.  Antidepressant meds make her gaining more weight. She tried PT but cannot bend her knee due to her arthritis. Has not tried water exercise.    ROS: as per HPI  PE: General: alert, well-developed, and cooperative to examination.  Lungs: normal respiratory effort, no accessory muscle use, normal breath sounds, no crackles, and no wheezes. Heart: normal rate, regular rhythm, no murmur, no gallop, and no rub.  Abdomen: soft, non-tender, normal bowel sounds, no distention, no guarding, no rebound tenderness, no hepatomegaly, and no splenomegaly.  Neurologic: nonfocal Skin: turgor normal and no rashes.  Psych: appropriate

## 2013-01-21 NOTE — Assessment & Plan Note (Addendum)
BP Readings from Last 3 Encounters:  01/21/13 153/98  01/16/13 138/94  11/29/12 134/84    Lab Results  Component Value Date   NA 140 10/29/2012   K 3.4* 10/29/2012   CREATININE 0.90 10/29/2012    Assessment:  Blood pressure control:  NO  Progress toward BP goal:   NO  She has been on atenolol but she also has active asthma.  Will need to stop BB  Plan:  Medications:  STop Atenolol and Lasix                                   Start Lisinopril-hctz 20/12.5mg  qd    BMP today    Repeat BP and BMP in 4 weeks   Educational resources provided: brochure  Self management tools provided: home blood pressure logbook

## 2013-01-21 NOTE — Assessment & Plan Note (Signed)
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  She has tried multiple therapy in the past without much relief including Mobic, Naproxen, Aleve, sport medicine.  I emphasized the importance of weight loss. -Try Voltaren gel QID -Weight loss -Aerobic exercise

## 2013-01-21 NOTE — Patient Instructions (Addendum)
Stop taking Zoloft Start taking Wellbutrin 150mg  one tablet daily Try Voltaren gel four times daily on your joint It is extremely important that you continue to work on weight loss because it will help decrease the pressure on your knees STop atenolol and Lasix Start taking Lisinopril/hydrochlorothiazide 20/12.5mg  one tablet daily for your blood pressure Follow up in 4 weeks

## 2013-02-03 ENCOUNTER — Telehealth: Payer: Self-pay | Admitting: Pulmonary Disease

## 2013-02-03 DIAGNOSIS — G4733 Obstructive sleep apnea (adult) (pediatric): Secondary | ICD-10-CM

## 2013-02-03 NOTE — Telephone Encounter (Signed)
Download 2/14 - cpap 15 cm very effective She has some leak - needs better seal on mask, usage 5h

## 2013-02-04 NOTE — Telephone Encounter (Signed)
Pt returned call.  Joan Brown ° °

## 2013-02-04 NOTE — Telephone Encounter (Signed)
lmomtcb x1 for pt 

## 2013-02-05 NOTE — Telephone Encounter (Signed)
Arrange for ONO on CPAP/ RA to decide

## 2013-02-05 NOTE — Telephone Encounter (Signed)
I spoke with pt and is aware of plan. Order has been placed. Will forward to [facility]'s to ensure they received order.

## 2013-02-05 NOTE — Telephone Encounter (Signed)
I spoke with patient about results and she verbalized understanding and had no questions. Pt states she thinks she needs oxygen with the CPAP bc she is still waking up gasping for air. Please advise Dr. Vassie Loll thanks

## 2013-02-07 ENCOUNTER — Ambulatory Visit (INDEPENDENT_AMBULATORY_CARE_PROVIDER_SITE_OTHER): Payer: No Typology Code available for payment source | Admitting: Pulmonary Disease

## 2013-02-07 ENCOUNTER — Encounter: Payer: Self-pay | Admitting: Pulmonary Disease

## 2013-02-07 VITALS — BP 140/88 | HR 100 | Temp 98.9°F | Ht 66.0 in | Wt 292.2 lb

## 2013-02-07 DIAGNOSIS — G4730 Sleep apnea, unspecified: Secondary | ICD-10-CM

## 2013-02-07 MED ORDER — OLMESARTAN MEDOXOMIL-HCTZ 20-12.5 MG PO TABS: 1.0000 | ORAL_TABLET | Freq: Every day | ORAL | Status: DC

## 2013-02-07 MED ORDER — DEXLANSOPRAZOLE 60 MG PO CPDR: 60.0000 mg | DELAYED_RELEASE_CAPSULE | Freq: Every day | ORAL | Status: DC

## 2013-02-07 NOTE — Progress Notes (Signed)
  Subjective:    Patient ID: Joan Brown, female    DOB: 12-22-1970, 42 y.o.   MRN: [id number]  HPI 41/F , ex -smoker, obese for FU of recurrent episodic cough/ wheezing being treated as asthma.  She has frequent ER visits. Single mom with 3 little ones (adopting) -2-3 ER visits/ yr  05/05/10--evaluated by Dr Donnie Aho. Repeated CXrs were nml as were CT angiograms on 04/02/10 & 04/15/10. She reports 'asthma' & bronchitis since childhood . She reports non productive cough without diurnal or seasonal variation.  -started on Symbicort 160/4.53mcg . Spiriva and pulmicort stopped.  PFT showed FEV1 2.20 l/m (76%) , s/p bronchodilator w 13% change.  Home sleep study showed mild obstructive sleep apnea - AHI 8/h  PSG 10/13 showed TST 307 mins, AHI 18/h with 50% REM sleep ? Rebound - REM AHI 32/h  11/29/2012  Was getting [facility]s from [facility], Now she is enrolled in asthma study over at [employer] - where she was given symbicort  c/o hacking cough x 1 month, wheezing, chest tx, increase SOB w/ exertion, HA, increase in BP  wears cpap everynight x 7-8 hrs a night. mask is too big.  CPAP titration showed CPAP 14 cm, med FF mask.  Download 12/13 shows no residuals on 14 cm, leak ok, good usage, c/o snoring  Needs more air  Eyes puffy in am, mask too bulky  Out of study now, on atenolol for htn  Gags when laughing or during naps sometimes  02/07/2013  breathing has unchanged, cough w/ yellow phlem, wheezing, chest tx, slight nasal congestion, PND.  she thinks she needs oxygen with the CPAP bc she is still waking up gasping for air - ONO  still wearing CPAP everynight. pt states she feels like she is smothering.  Download 2/14 - cpap 15 cm very effective  She has some leak - needs better seal on mask, usage 5h    Review of Systems neg for any significant sore throat, dysphagia, itching, sneezing, nasal congestion or excess/ purulent secretions, fever, chills, sweats, unintended wt loss, pleuritic or  exertional cp, hempoptysis, orthopnea pnd or change in chronic leg swelling. Also denies presyncope, palpitations, heartburn, abdominal pain, nausea, vomiting, diarrhea or change in bowel or urinary habits, dysuria,hematuria, rash, arthralgias, visual complaints, headache, numbness weakness or ataxia.     Objective:   Physical Exam  Gen. Pleasant, obese, in no distress ENT - no lesions, no post nasal drip Neck: No JVD, no thyromegaly, no carotid bruits Lungs: no use of accessory muscles, no dullness to percussion, decreased without rales or rhonchi  Cardiovascular: Rhythm regular, heart sounds  normal, no murmurs or gallops, no peripheral edema Musculoskeletal: No deformities, no cyanosis or clubbing , no tremors       Assessment & Plan:

## 2013-02-07 NOTE — Patient Instructions (Addendum)
Your cough may be related to sinus drip or reflux Samples of dexilant STOP lisinopril, Take Benicar-HCTZ 20/12.5 instead -samples x 2 weeks Decrease albuterol use CHeck oxygen levels on CPAP Go over to the sleep lab [id number] for mask fiting session - your pressure of 15 cm is adequate

## 2013-02-12 ENCOUNTER — Encounter: Payer: Self-pay | Admitting: Pulmonary Disease

## 2013-02-19 ENCOUNTER — Encounter: Payer: Self-pay | Admitting: Internal Medicine

## 2013-02-24 NOTE — Assessment & Plan Note (Signed)
CHeck oxygen levels on CPAP Go over to the sleep lab [id number] for mask fiting session - your pressure of 15 cm is adequate  Weight loss encouraged, compliance with goal of at least 4-6 hrs every night is the expectation. Advised against medications with sedative side effects Cautioned against driving when sleepy - understanding that sleepiness will vary on a day to day basis

## 2013-02-24 NOTE — Assessment & Plan Note (Signed)
Your cough may be related to sinus drip or reflux - doubt asthma flare Samples of dexilant STOP lisinopril, Take Benicar-HCTZ 20/12.5 instead -samples x 2 weeks Decrease albuterol use

## 2013-02-26 ENCOUNTER — Telehealth: Payer: Self-pay | Admitting: Pulmonary Disease

## 2013-02-26 NOTE — Telephone Encounter (Signed)
ONO on CPAP/ RA - no significant desaturations Does not need additional O2

## 2013-02-27 ENCOUNTER — Ambulatory Visit: Payer: No Typology Code available for payment source | Admitting: Adult Health

## 2013-02-27 NOTE — Telephone Encounter (Signed)
lmtcb x1 

## 2013-02-27 NOTE — Telephone Encounter (Signed)
Returning call.

## 2013-02-28 NOTE — Telephone Encounter (Signed)
I spoke with patient about results and she verbalized understanding and had no questions 

## 2013-03-07 ENCOUNTER — Encounter: Payer: Self-pay | Admitting: Pulmonary Disease

## 2013-03-18 ENCOUNTER — Encounter: Payer: Self-pay | Admitting: Internal Medicine

## 2013-03-18 DIAGNOSIS — R269 Unspecified abnormalities of gait and mobility: Secondary | ICD-10-CM | POA: Insufficient documentation

## 2013-04-10 ENCOUNTER — Encounter: Payer: Self-pay | Admitting: Internal Medicine

## 2013-04-10 ENCOUNTER — Ambulatory Visit (INDEPENDENT_AMBULATORY_CARE_PROVIDER_SITE_OTHER): Payer: Medicaid Other | Admitting: Internal Medicine

## 2013-04-10 ENCOUNTER — Ambulatory Visit (HOSPITAL_COMMUNITY)
Admission: RE | Admit: 2013-04-10 | Discharge: 2013-04-10 | Disposition: A | Discharge status: Home / Self Care | Payer: Self-pay | Source: Ambulatory Visit | Attending: Internal Medicine | Admitting: Internal Medicine

## 2013-04-10 ENCOUNTER — Encounter: Payer: Self-pay | Admitting: *Deleted

## 2013-04-10 VITALS — BP 137/89 | HR 76 | Temp 98.1°F | Ht 65.0 in | Wt 287.0 lb

## 2013-04-10 DIAGNOSIS — M898X8 Other specified disorders of bone, other site: Secondary | ICD-10-CM | POA: Insufficient documentation

## 2013-04-10 DIAGNOSIS — M869 Osteomyelitis, unspecified: Secondary | ICD-10-CM | POA: Insufficient documentation

## 2013-04-10 DIAGNOSIS — R109 Unspecified abdominal pain: Secondary | ICD-10-CM | POA: Insufficient documentation

## 2013-04-10 DIAGNOSIS — R1031 Right lower quadrant pain: Secondary | ICD-10-CM

## 2013-04-10 DIAGNOSIS — M25559 Pain in unspecified hip: Secondary | ICD-10-CM | POA: Insufficient documentation

## 2013-04-10 NOTE — Progress Notes (Signed)
Pt walked into clinic with c/o pain to rt groin area.  Hurts to ambulate. Pain is on and off, increase with coughing.  Rates pain 5/10.  No know injury.   No hx of same. She has tried naproxen with some relief. Denies radiation. Will see now

## 2013-04-10 NOTE — Assessment & Plan Note (Signed)
Unclear etiology at this point. Differential diagnosis include degenerative disease considering patient's chronic knee pain and obesity. Other differential diagnosis include MS: Necrosis since patient has history of asthma and has been on prednisone on and off in the past. Incarcerated hernia is unlikely since patient is more complaining about diarrhea then constipation  and patient symptoms has been present for 3 days . Patient would look for sick then she presented to the clinic if she had incarcerated hernia. At this point I recommend a hip x-ray to evaluate for degenerative disease. Recommended to continue her naproxen at least 2 times a day for pain relief. If x-ray negative would proceed with further evaluation for a vascular necrosis.

## 2013-04-10 NOTE — Progress Notes (Signed)
Subjective:   Patient ID: Joan Brown female   DOB: 1971/07/31 42 y.o.   MRN: [id number]  HPI: [name] is a 41 y.o. female with past medical history significant as outlined below who presented to with right groin pain for 3 days. Patient describes the pain as a sharp pain which worsened with coughing and walking especially using steps. It does not radiate. She denies any swelling, redness in that area. Denies any injury or lifting heavy weights. She has never experienced this before. Denies any fevers or chills.  Denies any nausea, vomiting, abdominal pain, constipation but reported loose stool x2 per day since the last 3 days which is unusual for her. No blood present in the stool. Denies any changes in urinary habits. Denies any numbness or tingling in her legs. Patient has chronic leg and knee pain in general which did not worsen. Patient is currently taking naproxen which somewhat improved her symptoms. She takes naproxen for her chronic knee pain.   Past Medical History  Diagnosis Date  . COPD (chronic obstructive pulmonary disease)   . Hidradenitis suppurativa   . Asthma     PFT 6/11 FEV1 76%, 13% chagne sp BD.   Marland Kitchen Benign essential hypertension   . Morbid obesity   . Chest pain   . Obstructive sleep apnea   . Arthritis   . Osteoporosis    Current Outpatient Prescriptions  Medication Sig Dispense Refill  . albuterol (PROVENTIL HFA;VENTOLIN HFA) 108 (90 BASE) MCG/ACT inhaler Inhale 2 puffs into the lungs every 4 (four) hours as needed. FOR SHORTNESS OF BREATH  4 Inhaler  3  . albuterol (PROVENTIL) (2.5 MG/3ML) 0.083% nebulizer solution Take 3 mLs (2.5 mg total) by nebulization every 6 (six) hours as needed. FOR SHORTNESS OF BREATH  75 mL  4  . buPROPion (WELLBUTRIN XL) 150 MG 24 hr tablet Take 1 tablet (150 mg total) by mouth every morning.  30 tablet  2  . busPIRone (BUSPAR) 10 MG tablet Take 10 mg by mouth 2 (two) times daily.      . chlorpheniramine-HYDROcodone (TUSSIONEX  PENNKINETIC ER) 10-8 MG/5ML LQCR Take 5 mLs by mouth every 12 (twelve) hours as needed.  100 mL  1  . dexlansoprazole (DEXILANT) 60 MG capsule Take 1 capsule (60 mg total) by mouth daily.  7 capsule  0  . mometasone-formoterol (DULERA) 200-5 MCG/ACT AERO Inhale 2 puffs into the lungs 2 (two) times daily.  1 Inhaler  0  . naproxen (NAPROSYN) 500 MG tablet Take 1 tablet (500 mg total) by mouth 2 (two) times daily with a meal.  120 tablet  5  . olmesartan-hydrochlorothiazide (BENICAR HCT) 20-12.5 MG per tablet Take 1 tablet by mouth daily.  7 tablet  0   No current facility-administered medications for this visit.   Family History  Problem Relation Age of Onset  . Alcohol abuse Sister   . Asthma Other   . Diabetes Mother   . Hypertension Mother   . Diabetes Father   . Hypertension Father   . Heart disease Father   . Cancer Paternal Aunt     skin  . Cancer Paternal Uncle     skin  . Cancer Maternal Grandmother     pancreatic  . Stroke Paternal Grandmother    History   Social History  . Marital Status: Single    Spouse Name: N/A    Number of Children: 1  . Years of Education: N/A   Occupational History  . hair  stylist    Social History Main Topics  . Smoking status: Former Smoker -- 1.00 packs/day for 20 years    Types: Cigarettes    Quit date: 12/11/2009  . Smokeless tobacco: Never Used  . Alcohol Use: Yes     Comment: occasionally  . Drug Use: No  . Sexually Active: None   Other Topics Concern  . None   Social History Narrative   Financial assistance approved for 100% discount at [facility] and has [facility] card per Rudell Cobb   11/11/2010       patient is single with no children she is a hairstylist         Review of Systems: Constitutional: Denies fever, chills, diaphoresis, appetite change and fatigue.  Respiratory: Denies SOB, DOE, cough, chest tightness,  and wheezing.   Cardiovascular: Denies chest pain, palpitations and leg swelling.  Gastrointestinal: Denies  nausea, vomiting, abdominal pain, , constipation, blood in stool and abdominal distention.  Genitourinary: Denies dysuria, urgency, frequency, hematuria, flank pain and difficulty urinating.  Skin: Denies pallor, rash and wound.    Objective:  Physical Exam: Filed Vitals:   04/10/13 0919  BP: 137/89  Pulse: 76  Temp: 98.1 F (36.7 C)  TempSrc: Oral  Height: 5\' 5"  (1.651 m)  Weight: 287 lb (130.182 kg)  SpO2: 95%   Constitutional: Vital signs reviewed.  Patient is a morbidly obese female in no acute distress and cooperative with exam. Alert and oriented x3.  Cardiovascular: RRR, S1 normal, S2 normal, no MRG, pulses symmetric and intact bilaterally Pulmonary/Chest: CTAB, no wheezes, rales, or rhonchi Abdominal: Soft. Non-tender, non-distended, bowel sounds are normal, no masses, organomegaly, or guarding present.  GU: no CVA tenderness Right groin: Significant tenderness on palpation. The pain is reviewed producible with internal rotation, abduction and adduction. No mass, erythema present.  Hematology: no cervical, inguinal adenopathy.  Neurological: A&O x3, Strength is normal and symmetric bilaterally,  sensory intact to light touch bilaterally.  Skin: Warm, dry and intact. No rash, cyanosis, or clubbing.

## 2013-04-10 NOTE — Progress Notes (Signed)
Case discussed with Dr. Illath at time of visit.  We reviewed the resident's history and exam and pertinent patient test results.  I agree with the assessment, diagnosis, and plan of care documented in the resident's note. 

## 2013-04-16 ENCOUNTER — Other Ambulatory Visit: Payer: Self-pay | Admitting: Internal Medicine

## 2013-04-16 DIAGNOSIS — R1031 Right lower quadrant pain: Secondary | ICD-10-CM

## 2013-04-17 NOTE — Progress Notes (Signed)
Appt scheduled 5/19 @ 1000AM.

## 2013-04-28 ENCOUNTER — Ambulatory Visit (HOSPITAL_COMMUNITY)
Admission: RE | Admit: 2013-04-28 | Discharge: 2013-04-28 | Disposition: A | Discharge status: Home / Self Care | Payer: Medicaid Other | Source: Ambulatory Visit | Attending: Internal Medicine | Admitting: Internal Medicine

## 2013-04-28 ENCOUNTER — Encounter: Payer: Self-pay | Admitting: Internal Medicine

## 2013-04-28 DIAGNOSIS — R109 Unspecified abdominal pain: Secondary | ICD-10-CM | POA: Insufficient documentation

## 2013-04-28 DIAGNOSIS — M8530 Osteitis condensans, unspecified site: Secondary | ICD-10-CM | POA: Insufficient documentation

## 2013-04-28 DIAGNOSIS — N7013 Chronic salpingitis and oophoritis: Secondary | ICD-10-CM | POA: Insufficient documentation

## 2013-04-28 DIAGNOSIS — R1031 Right lower quadrant pain: Secondary | ICD-10-CM

## 2013-04-28 DIAGNOSIS — IMO0002 Reserved for concepts with insufficient information to code with codable children: Secondary | ICD-10-CM | POA: Insufficient documentation

## 2013-04-28 DIAGNOSIS — D259 Leiomyoma of uterus, unspecified: Secondary | ICD-10-CM | POA: Insufficient documentation

## 2013-05-12 ENCOUNTER — Telehealth: Payer: Self-pay | Admitting: *Deleted

## 2013-05-12 NOTE — Telephone Encounter (Signed)
Pt calls and states she has not heard from recent radiology procedure, states she is getting worse, pain in R groin is very bad, also 2 days ago pain started in R forearm, she cannot come today will come wed dr brown

## 2013-05-12 NOTE — Telephone Encounter (Signed)
Agree with appointment Wednesday, can address at that visit.

## 2013-05-14 ENCOUNTER — Encounter: Payer: Self-pay | Admitting: Internal Medicine

## 2013-05-14 ENCOUNTER — Ambulatory Visit (INDEPENDENT_AMBULATORY_CARE_PROVIDER_SITE_OTHER): Payer: Medicaid Other | Admitting: Internal Medicine

## 2013-05-14 VITALS — BP 161/98 | HR 65 | Temp 99.2°F | Ht 65.0 in | Wt 289.3 lb

## 2013-05-14 DIAGNOSIS — M8530 Osteitis condensans, unspecified site: Secondary | ICD-10-CM

## 2013-05-14 DIAGNOSIS — M869 Osteomyelitis, unspecified: Secondary | ICD-10-CM

## 2013-05-14 DIAGNOSIS — I10 Essential (primary) hypertension: Secondary | ICD-10-CM

## 2013-05-14 DIAGNOSIS — M25562 Pain in left knee: Secondary | ICD-10-CM

## 2013-05-14 DIAGNOSIS — M25569 Pain in unspecified knee: Secondary | ICD-10-CM

## 2013-05-14 MED ORDER — PREDNISONE 20 MG PO TABS: 60.0000 mg | ORAL_TABLET | Freq: Every day | ORAL | Status: AC

## 2013-05-14 NOTE — Progress Notes (Signed)
INTERNAL MEDICINE TEACHING ATTENDING ADDENDUM: I discussed this case with Dr. Brown soon after the patient visit. I have read the documentation and I agree with the plan of care. Please see the resident note above for details of management.  

## 2013-05-14 NOTE — Assessment & Plan Note (Signed)
The patient notes a 1- month history of right groin pain.  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  PEX confirmed this diagnosis, with pain at pubic symphysis and pain on right hip adduction. -continue rest, ice, naproxen -start prednisone 60 mg/day x5 days -referral to PT for groin stretching and strengthening exercises -return for a follow-up visit in 4 weeks

## 2013-05-14 NOTE — Assessment & Plan Note (Signed)
BP Readings from Last 3 Encounters:  05/14/13 161/98  04/10/13 137/89  02/07/13 140/88    Lab Results  Component Value Date   NA 139 01/21/2013   K 4.0 01/21/2013   CREATININE 0.88 01/21/2013    Assessment: Blood pressure control: mildly elevated Progress toward BP goal:  deteriorated Comments: BP mildly elevated, likely due to pain.  Will reassess at next visit  Plan: Medications:  continue current medications Educational resources provided:   Self management tools provided:   Other plans: Recheck at next visit

## 2013-05-14 NOTE — Progress Notes (Signed)
HPI The patient is a 42 y.o. female with a history of HTN, hidradenitis suppurativa, asthma, presenting for a follow-up visit for right groin pain.  The patient notes a 29-month history of right groin pain, described as a dull pain, with a sharp pain component radiating to her right knee.  She notes no preceding trauma.  She has been taking ibuprofen (twice/day) and naproxen (twice/day).  She has also taken some of her mother's vicoden, which helped in the short term.  She has also tried icing the area.  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  ROS: General: no fevers, chills, changes in weight, changes in appetite Skin: no rash HEENT: no blurry vision, hearing changes, sore throat Pulm: no dyspnea, coughing, wheezing CV: no chest pain, palpitations, shortness of breath Abd: no abdominal pain, nausea/vomiting, diarrhea/constipation GU: no dysuria, hematuria, polyuria Ext: no arthralgias, myalgias Neuro: no weakness, numbness, or tingling  Filed Vitals:   05/14/13 0940  BP: 161/98  Pulse: 65  Temp: 99.2 F (37.3 C)    PEX General: alert, cooperative, and in no apparent distress HEENT: pupils equal round and reactive to light, vision grossly intact, oropharynx clear and non-erythematous  Neck: supple, no lymphadenopathy Lungs: clear to ascultation bilaterally, normal work of respiration, no wheezes, rales, ronchi Heart: regular rate and rhythm, no murmurs, gallops, or rubs Abdomen: soft, non-tender, non-distended, normal bowel sounds Extremities: Tenderness to palpation of right anterior hip and pubic symphysis, pain elicited on adduction of right hip Neurologic: alert & oriented X3, cranial nerves II-XII intact, strength grossly intact though limited by pain, sensation intact to light touch  Current Outpatient Prescriptions on File Prior to Visit  Medication Sig Dispense Refill  . albuterol  (PROVENTIL HFA;VENTOLIN HFA) 108 (90 BASE) MCG/ACT inhaler Inhale 2 puffs into the lungs every 4 (four) hours as needed. FOR SHORTNESS OF BREATH  4 Inhaler  3  . albuterol (PROVENTIL) (2.5 MG/3ML) 0.083% nebulizer solution Take 3 mLs (2.5 mg total) by nebulization every 6 (six) hours as needed. FOR SHORTNESS OF BREATH  75 mL  4  . buPROPion (WELLBUTRIN XL) 150 MG 24 hr tablet Take 1 tablet (150 mg total) by mouth every morning.  30 tablet  2  . chlorpheniramine-HYDROcodone (TUSSIONEX PENNKINETIC ER) 10-8 MG/5ML LQCR Take 5 mLs by mouth every 12 (twelve) hours as needed.  100 mL  1  . dexlansoprazole (DEXILANT) 60 MG capsule Take 1 capsule (60 mg total) by mouth daily.  7 capsule  0  . mometasone-formoterol (DULERA) 200-5 MCG/ACT AERO Inhale 2 puffs into the lungs 2 (two) times daily.  1 Inhaler  0  . naproxen (NAPROSYN) 500 MG tablet Take 1 tablet (500 mg total) by mouth 2 (two) times daily with a meal.  120 tablet  5  . olmesartan-hydrochlorothiazide (BENICAR HCT) 20-12.5 MG per tablet Take 1 tablet by mouth daily.  7 tablet  0   No current facility-administered medications on file prior to visit.    Assessment/Plan

## 2013-05-14 NOTE — Patient Instructions (Addendum)
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  We will treat this with the following: 1. Continue to rest area 2.  Apply ice for no more than 10 minutes at a time to your groin 3. Continue to take Naproxen, 1 tablet twice per day 4. Take Prednisone, 3 tablets per day for 5 days 5. We are referring you to Physical therapy, for groin stretching and strengthening exercises  Please return for a follow-up visit in 4 weeks, or sooner if needed for knee injections.  Treatment Goals:  Goals (1 Years of Data) as of 05/14/13     Lifestyle    . Prevent Falls       Progress Toward Treatment Goals:  Treatment Goal 05/14/2013  Blood pressure deteriorated  Prevent falls unchanged    Self Care Goals & Plans:  Self Care Goal 05/14/2013  Manage my medications take my medicines as prescribed; refill my medications on time  Monitor my health keep track of my blood pressure; keep track of my weight  Eat healthy foods eat foods that are low in salt; eat baked foods instead of fried foods  Be physically active (No Data)       Care Management & Community Referrals:  Referral 05/14/2013  Referrals made for care management support none needed      Osteitis Pubis with Rehab Osteitis pubis is an overuse injury of the joint in the front of the pelvis (symphysis pubis), which connects the two pubic bones of the pelvis. The joint is lined with cartilage, surrounded by a joint covering (capsule), and filled with joint (synovial) fluid. Osteitis pubis involves bone loss (resorption) of the ends of the pubic bones, which causes pain. The cause of osteitis pubis is unknown, but it may be a reaction to repeated stress on the pubic bone. SYMPTOMS   Pain, discomfort tenderness, and swelling at the front of the pelvis, over the pubic symphysis joint.  Pain that possibly extends to the groin, inner thigh, or lower  belly.  Symptoms that slowly increase in frequency and eventually become constant.  Pain that gets worse when pivoting on one leg, kicking a ball, sprinting, jumping, climbing stairs, or suddenly changing direction while running. Pain that gets worse with stretching, particularly separating the legs and thighs, or with bringing the thighs and legs together against resistance.  Walking or running with a limp.  Weakness when bending the hip or kicking.  Sometimes, clicking in the front of the pelvis.  Possibly, no symptoms. CAUSES  The cause of osteitis pubis is unknown. However, it is believed to be caused by repeated stress on the pubic bone, that occurs faster than the body can heal.  RISK INCREASES WITH:   Sports that require repetitive kicking (soccer or football kicking), sports that require repetitive jumping, as well as distance runners, fencers, ice hockey players, and weightlifters.  Poor strength and flexibility.  Previous osteitis pubis.  Previous sprain or injury to the pelvis.  Stiffness or loss of hip motion.  Previous hip injury.  Rheumatoid arthritis of the spine (ankylosing spondylitis).  Bladder or prostate surgery. PREVENTION  Avoid trauma to the hip.  Maintain physical fitness:  Strength, flexibility, and endurance.  Cardiovascular fitness.  Learn and use proper technique. PROGNOSIS  If treated properly, osteitis pubis is usually curable within 3 to 8 months. RELATED COMPLICATIONS   Recurring symptoms, especially if activity is resumed too soon.  Longer healing time,  if usual activities are resumed too soon.  Chronic pain and inflammation of the joint in the front of the pelvis.  Unstable or arthritic joint, following continued injury or delayed treatment. TREATMENT Osteitis pubis should only be treated if symptoms are present. Treatment first involves use of ice and medicine to reduce pain and inflammation. It is important to modify  activities that aggravate symptoms, so there is little to no pain during activity. If an activity cannot be performed without pain, it should be eliminated. The use of strengthening and stretching exercises may help reduce pain with activity. Exercises may be performed at home or with a therapist. Sometimes, corticosteroids may be given by injection or pills, to help reduce inflammation. It is necessary to return to sports gradually, after symptoms have gone away. If symptoms persist, despite non-surgical treatment, or you are unwilling or unable to give up certain activities, surgery may be needed. Surgery may involve removing motion from the joint (fusing) or cleaning the joint.  MEDICATION   If pain medicine is needed, nonsteroidal anti-inflammatory medicines (aspirin and ibuprofen), or other minor pain relievers (acetaminophen), are often advised.  Do not take pain medicine for 7 days before surgery.  Prescription pain relievers may be given, if your caregiver thinks they are needed. Use only as directed and only as much as you need.  Corticosteroid injections may be given by your caregiver. These injections should be reserved for the most serious cases, because they may only be given a certain number of times. HEAT AND COLD  Cold treatment (icing) should be applied for 10 to 15 minutes every 2 to 3 hours for inflammation and pain, and immediately after activity that aggravates your symptoms. Use ice packs or an ice massage.  Heat treatment may be used before performing stretching and strengthening activities prescribed by your caregiver, physical therapist, or athletic trainer. Use a heat pack or a warm water soak. SEEK MEDICAL CARE IF:  Pain, tenderness, or swelling gets worse or does not improve, despite 2 to 6 weeks of treatment.  New, unexplained symptoms develop. (Drugs used in treatment may produce side effects.) EXERCISES RANGE OF MOTION (ROM) AND STRETCHING EXERCISES - Osteitis  Pubis These exercises may help you when beginning to rehabilitate your injury. Doing them too aggressively can make your condition worse. Complete them slowly and gently. Your symptoms may go away with or without further involvement from your physician, physical therapist or athletic trainer. While completing these exercises, remember:   Restoring tissue flexibility helps normal motion to return to the joints. This allows healthier, less painful movement and activity.  An effective stretch should be held for at least 30 seconds.  A stretch should never be painful. You should only feel a gentle lengthening or release in the stretched tissue. If these stretches make your symptoms worse, even when done gently, consult your physician, physical therapist or athletic trainer. STRETCH - Hamstrings/Adductors, V-Sit  Sit on the floor with your legs extended in a large "V," keeping your knees straight.  With your head and chest upright, bend at your waist, reaching for your left foot to stretch your right thigh muscles.  You should feel a stretch in your right inner thigh. Hold for __________ seconds.  Return to the upright position to relax your leg muscles.  Continuing to keep your chest upright, bend straight forward at your waist, to stretch your hamstrings.  You should feel a stretch behind both of your thighs and knees. Hold for __________ seconds.  Return to the upright position to relax your leg muscles.  With your head and chest upright, bend at your waist, reaching for your right foot to stretch your left thigh muscles.  You should feel a stretch in your left inner thigh. Hold for __________ seconds.  Return to the upright position to relax your leg muscles. Repeat __________ times. Complete this exercise __________ times per day.  STRETCHING - Hip Flexors, Lunge  Half kneel with your right / left knee on the floor and your opposite knee bent and directly over your ankle.  Keep good  posture with your head over your shoulders. Tighten your buttocks to point your tailbone downward. This will prevent your back from arching too much.  You should feel a gentle stretch in the front of your thigh and hip. If you do not feel any resistance, slightly slide your opposite foot forward and then slowly lunge forward, so your knee once again lines up over your ankle. Be sure your tailbone remains pointed downward.  Hold this stretch for __________ seconds. Repeat __________ times. Complete this stretch __________ times per day. STRETCH - Adductors, Lunge  While standing, spread your legs, with your right / left leg behind you.  Lean away from your right / left leg, by bending your opposite knee. You may rest your hands on your thigh for balance.  You should feel a stretch in your right / left inner thigh. Hold for __________ seconds. Repeat __________ times. Complete this exercise __________ times per day.  STRETCH - Adductors, Standing  Place your right / left foot on a counter or stable table. Turn away from your leg, so both hips line up with your right / left leg.  Keeping your hips facing forward, slowly bend your opposite leg until you feel a gentle stretch on the inside of your right / left thigh.  Hold for __________ seconds. Repeat __________ times. Complete this exercise __________ times per day.  STRENGTHENING EXERCISES - Osteitis Pubis These exercises may help you when beginning to rehabilitate your injury. They may resolve your symptoms with or without further involvement from your physician, physical therapist or athletic trainer. While completing these exercises, remember:   Muscles can gain both the endurance and the strength needed for everyday activities through controlled exercises.  Complete these exercises as instructed by your physician, physical therapist or athletic trainer. Progress the resistance and repetitions only as guided. STRENGTH - Hip Adductors,  isometrics  Sit on a firm chair, so that your knees are at about the same height as your hips.  Place a large ball, firm pillow or rolled up bath towel between your thighs.  Squeeze your thighs together, gradually building tension. Hold for __________ seconds.  Release the tension gradually, and allow your inner thigh muscles to relax completely before repeating the exercise. Repeat __________ times. Complete this exercise __________ times per day.  STRENGTH - Hip Adduction  Lie on your side, with your right / left leg on the bottom.  Place the foot of your top leg flat on the floor for balance. It may be in front or behind the bottom leg.  Lift the bottom leg. Hold this position for __________ seconds.  Slowly lower your leg to the starting position.  Repeat exercise __________ times, __________ times per day. Document Released: 11/27/2005 Document Revised: 02/19/2012 Document Reviewed: 03/11/2009 [facility] Patient Information 2014 [place], [place].

## 2013-05-14 NOTE — Assessment & Plan Note (Signed)
The patient has a history of left knee pain, with significant OA.  The patient has been seen by Sports Medicine, and has been managed with knee injections in the past, last injection was about 1 year ago. -patient may return for knee injection later this month

## 2013-06-30 ENCOUNTER — Ambulatory Visit: Payer: Medicaid Other | Admitting: Physical Therapy

## 2013-07-22 ENCOUNTER — Encounter: Payer: Self-pay | Admitting: Internal Medicine

## 2013-07-22 ENCOUNTER — Ambulatory Visit (INDEPENDENT_AMBULATORY_CARE_PROVIDER_SITE_OTHER): Payer: Medicaid Other | Admitting: Internal Medicine

## 2013-07-22 VITALS — BP 132/91 | HR 71 | Temp 97.3°F | Ht 66.0 in | Wt 285.9 lb

## 2013-07-22 DIAGNOSIS — M8530 Osteitis condensans, unspecified site: Secondary | ICD-10-CM

## 2013-07-22 DIAGNOSIS — F329 Major depressive disorder, single episode, unspecified: Secondary | ICD-10-CM

## 2013-07-22 DIAGNOSIS — M545 Low back pain: Secondary | ICD-10-CM

## 2013-07-22 DIAGNOSIS — M25561 Pain in right knee: Secondary | ICD-10-CM

## 2013-07-22 DIAGNOSIS — I10 Essential (primary) hypertension: Secondary | ICD-10-CM

## 2013-07-22 DIAGNOSIS — R269 Unspecified abnormalities of gait and mobility: Secondary | ICD-10-CM

## 2013-07-22 DIAGNOSIS — J45909 Unspecified asthma, uncomplicated: Secondary | ICD-10-CM

## 2013-07-22 DIAGNOSIS — M25569 Pain in unspecified knee: Secondary | ICD-10-CM

## 2013-07-22 DIAGNOSIS — M869 Osteomyelitis, unspecified: Secondary | ICD-10-CM

## 2013-07-22 MED ORDER — DEXLANSOPRAZOLE 60 MG PO CPDR: 60.0000 mg | DELAYED_RELEASE_CAPSULE | Freq: Every day | ORAL | Status: DC

## 2013-07-22 MED ORDER — HYDROCODONE-ACETAMINOPHEN 5-325 MG PO TABS: 1.0000 | ORAL_TABLET | Freq: Four times a day (QID) | ORAL | Status: DC | PRN

## 2013-07-22 MED ORDER — ALBUTEROL SULFATE HFA 108 (90 BASE) MCG/ACT IN AERS: 2.0000 | INHALATION_SPRAY | RESPIRATORY_TRACT | Status: DC | PRN

## 2013-07-22 MED ORDER — ALBUTEROL SULFATE (2.5 MG/3ML) 0.083% IN NEBU: 2.5000 mg | INHALATION_SOLUTION | Freq: Four times a day (QID) | RESPIRATORY_TRACT | Status: DC | PRN

## 2013-07-22 MED ORDER — OLMESARTAN MEDOXOMIL-HCTZ 20-12.5 MG PO TABS: 1.0000 | ORAL_TABLET | Freq: Every day | ORAL | Status: DC

## 2013-07-22 MED ORDER — MOMETASONE FURO-FORMOTEROL FUM 200-5 MCG/ACT IN AERO: 2.0000 | INHALATION_SPRAY | Freq: Two times a day (BID) | RESPIRATORY_TRACT | Status: DC

## 2013-07-22 MED ORDER — BUPROPION HCL ER (XL) 150 MG PO TB24: 150.0000 mg | ORAL_TABLET | ORAL | Status: DC

## 2013-07-22 NOTE — Progress Notes (Signed)
Patient ID: Jahmiya Guidotti, female   DOB: July 17, 1971, 42 y.o.   MRN: [id number]          HPI: Ms.Luara Labreck is a 42 y.o. with a past medical history listed below presents for follow up of her right groin pain.  Patient was last seen in the clinic on 05/14/2013 by Dr. Manson Passey for diagnosis of osteitis pubis, for which she was prescribed a short course of prednisone 60 mg once daily for 5 days. She reports that since taking this medication her pain has not improved. She can barely sleep on some nights. She has significant difficulties in walking. The pain is still mostly concentrated in the right groin. It is 8/10 to 10 out of 10, it radiates into her right thigh. It is increased by walking. Nothing seems to relieve the pain. She also has other pains involving the left knee. She otherwise denies history of fevers, chills, ranges or increased fatigue.   Patient reports that she recently moved from [place] to [place] but she would like to continue coming to our clinic.  When I suggested a referral to a sports medicine clinic, the patient requested to be referred to a clinic that is in [place], which is her new location.     Past Medical History  Diagnosis Date  . COPD (chronic obstructive pulmonary disease)   . Hidradenitis suppurativa   . Asthma     PFT 6/11 FEV1 76%, 13% chagne sp BD.   Marland Kitchen Benign essential hypertension   . Morbid obesity   . Chest pain   . Obstructive sleep apnea   . Arthritis   . Osteoporosis    Current Outpatient Prescriptions  Medication Sig Dispense Refill  . albuterol (PROVENTIL HFA;VENTOLIN HFA) 108 (90 BASE) MCG/ACT inhaler Inhale 2 puffs into the lungs every 4 (four) hours as needed. FOR SHORTNESS OF BREATH  4 Inhaler  3  . albuterol (PROVENTIL) (2.5 MG/3ML) 0.083% nebulizer solution Take 3 mLs (2.5 mg total) by nebulization every 6 (six) hours as needed. FOR SHORTNESS OF BREATH  75 mL  4  . buPROPion (WELLBUTRIN XL) 150 MG 24 hr tablet Take 1 tablet  (150 mg total) by mouth every morning.  30 tablet  2  . dexlansoprazole (DEXILANT) 60 MG capsule Take 1 capsule (60 mg total) by mouth daily.  7 capsule  0  . HYDROcodone-acetaminophen (NORCO) 5-325 MG per tablet Take 1 tablet by mouth every 6 (six) hours as needed for pain.  120 tablet  0  . mometasone-formoterol (DULERA) 200-5 MCG/ACT AERO Inhale 2 puffs into the lungs 2 (two) times daily.  1 Inhaler  0  . olmesartan-hydrochlorothiazide (BENICAR HCT) 20-12.5 MG per tablet Take 1 tablet by mouth daily.  7 tablet  0   No current facility-administered medications for this visit.   Family History  Problem Relation Age of Onset  . Alcohol abuse Sister   . Asthma Other   . Diabetes Mother   . Hypertension Mother   . Diabetes Father   . Hypertension Father   . Heart disease Father   . Cancer Paternal Aunt     skin  . Cancer Paternal Uncle     skin  . Cancer Maternal Grandmother     pancreatic  . Stroke Paternal Grandmother    History   Social History  . Marital Status: Single    Spouse Name: N/A    Number of Children: 1  . Years of Education: N/A   Occupational History  .  hair stylist    Social History Main Topics  . Smoking status: Current Some Day Smoker -- 1.00 packs/day for 20 years    Types: Cigarettes    Last Attempt to Quit: 12/11/2009  . Smokeless tobacco: Never Used     Comment: restarted  . Alcohol Use: Yes     Comment: occasionally  . Drug Use: No  . Sexually Active: None   Other Topics Concern  . None   Social History Narrative   Financial assistance approved for 100% discount at [facility] and has [facility] card per Rudell Cobb   11/11/2010       patient is single with no children she is a hairstylist          Review of Systems: Constitutional: Denies fever, chills, diaphoresis, appetite change and fatigue.  Respiratory: Denies SOB, DOE, cough, chest tightness, and wheezing.  Cardiovascular: No chest pain, palpitations and leg swelling.  Gastrointestinal:  No abdominal pain, nausea, vomiting, bloody stools Genitourinary: No dysuria, frequency, hematuria, or flank pain.   Objective:  Physical Exam: Filed Vitals:   07/22/13 0913  BP: 132/91  Pulse: 71  Temp: 97.3 F (36.3 C)  TempSrc: Oral  Height: 5\' 6"  (1.676 m)  Weight: 285 lb 14.4 oz (129.683 kg)  SpO2: 100%   General: obese, in the moderate distress due to pain.  Lungs: CTA bilaterally. Heart: RRR; no extra sounds or murmurs  Abdomen: Non-distended, normal BS, soft, nontender; no hepatosplenomegaly  Extremities: significant tenderness on elevation of the right lower extremity. Any movement of of that limb elicits pain.She indeed has tenderness in the right groin towards the region of the symphysis pubis. There is no increased warmth, swelling or erythema. Adduction of the right hip elicits pain as well. The strength in the right LE is reduced due to pain. Neurologic: Alert and oriented x3. No obvious neurologic deficits.  Assessment & Plan:  I have discussed my assessment and plan  with Dr. Aundria Rud as detailed under problem based charting.

## 2013-07-22 NOTE — Patient Instructions (Addendum)
Please take Vicodin for your pain  I will arrange a referral to Sports Medicine for more evaluation. I have sent your prescription to your pharmacy  Please remember to refill your medication for high blood pressure on time. Please call clinic with question    Treatment Goals:  Goals (1 Years of Data) as of 07/22/13     Lifestyle    . Prevent Falls       Progress Toward Treatment Goals:  Treatment Goal 07/22/2013  Blood pressure unchanged  Stop smoking smoking more  Prevent falls unchanged    Self Care Goals & Plans:  Self Care Goal 07/22/2013  Manage my medications take my medicines as prescribed; bring my medications to every visit; refill my medications on time  Monitor my health -  Eat healthy foods drink diet soda or water instead of juice or soda; eat more vegetables; eat foods that are low in salt; eat baked foods instead of fried foods  Be physically active -

## 2013-07-22 NOTE — Addendum Note (Signed)
Addended by: Dow Adolph on: 07/22/2013 07:26 PM   Modules accepted: Level of Service

## 2013-07-22 NOTE — Assessment & Plan Note (Addendum)
Patient in significant pain. I have written a referral to sports medicine clinic, probably at [facility] in [place] where further evaluation and treatment can be considered. I have also prescribed Vicodin #120 pills for one month.  07/23/2013 9:00 AM Addendum: Patient called clinic complaining that she developed a rash from the Vicodin yesterday. It resolved with Benadryl. She does not feel she needs medical attention in terms of this rash at this time. She was surprised because she did not have any reaction from her mother's Vicodin she tried before. I discussed with her the we can try oxycodone. She agreed to pick up another prescription today.

## 2013-07-23 ENCOUNTER — Telehealth: Payer: Self-pay | Admitting: *Deleted

## 2013-07-23 MED ORDER — OXYCODONE HCL 5 MG PO TABS: 5.0000 mg | ORAL_TABLET | Freq: Four times a day (QID) | ORAL | Status: DC | PRN

## 2013-07-23 NOTE — Addendum Note (Signed)
Addended by: Dow Adolph on: 07/23/2013 09:03 AM   Modules accepted: Orders, Medications

## 2013-07-23 NOTE — Progress Notes (Signed)
Case discussed with Dr. Kazibwe soon after the resident saw the patient.  We reviewed the resident's history and exam and pertinent patient test results.  I agree with the assessment, diagnosis, and plan of care documented in the resident's note. 

## 2013-07-23 NOTE — Telephone Encounter (Signed)
Talked to her on phone. Please see my addendum to yesterday's note.

## 2013-07-23 NOTE — Telephone Encounter (Signed)
Call to pt to ask when she will get her [facility] card renewed so that a referral can be made too Sports Medicine.  Patient said that she would get her card renewed so that an appointment can be scheduled.  Pt also mentioned that she had picked up her Norco prescription and had taken 1.  The pill she said caused her to break out in a ras.  She said that she had taken a Hydrocodone pill once before from her mom and that she had no reaction to it.  When asked if she may have an allergy to Tylenol pt said that she did not think so.  Will inform Dr. Zada Girt about pt's reaction.  Call to [facility] they did not carry Hydrocodone with out the Tylenol.  Angelina Ok, RN 07/23/2013 8:43 AM

## 2013-09-11 ENCOUNTER — Telehealth: Payer: Self-pay | Admitting: *Deleted

## 2013-09-11 NOTE — Telephone Encounter (Signed)
CALLED AND SPOKE WITH MS Birnbaum. AGAIN REMINDED PATIENT TO CALL OUR OFFICE WITH A PHONE NUMBER OF A PT OFFICE IN WHICH SHE WOULD LIKE FOR [place] TO REFER HER IN [place].  PATIENT IS TO CALL [facility] WITH THIS INFORMATION.  Thurmon Mizell NTII  10-2-014  11:12AM

## 2013-09-11 NOTE — Addendum Note (Signed)
Addended by: Neomia Dear on: 09/11/2013 12:43 PM   Modules accepted: Orders

## 2013-09-25 ENCOUNTER — Encounter (HOSPITAL_COMMUNITY): Payer: Self-pay | Admitting: Emergency Medicine

## 2013-09-25 ENCOUNTER — Emergency Department (HOSPITAL_COMMUNITY)
Admission: EM | Admit: 2013-09-25 | Discharge: 2013-09-25 | Disposition: A | Discharge status: Home / Self Care | Payer: Medicaid Other | Attending: Emergency Medicine | Admitting: Emergency Medicine

## 2013-09-25 DIAGNOSIS — F17200 Nicotine dependence, unspecified, uncomplicated: Secondary | ICD-10-CM | POA: Insufficient documentation

## 2013-09-25 DIAGNOSIS — G4733 Obstructive sleep apnea (adult) (pediatric): Secondary | ICD-10-CM | POA: Insufficient documentation

## 2013-09-25 DIAGNOSIS — M810 Age-related osteoporosis without current pathological fracture: Secondary | ICD-10-CM | POA: Insufficient documentation

## 2013-09-25 DIAGNOSIS — H5789 Other specified disorders of eye and adnexa: Secondary | ICD-10-CM | POA: Insufficient documentation

## 2013-09-25 DIAGNOSIS — Z79899 Other long term (current) drug therapy: Secondary | ICD-10-CM | POA: Insufficient documentation

## 2013-09-25 DIAGNOSIS — R110 Nausea: Secondary | ICD-10-CM | POA: Insufficient documentation

## 2013-09-25 DIAGNOSIS — M129 Arthropathy, unspecified: Secondary | ICD-10-CM | POA: Insufficient documentation

## 2013-09-25 DIAGNOSIS — Z888 Allergy status to other drugs, medicaments and biological substances status: Secondary | ICD-10-CM | POA: Insufficient documentation

## 2013-09-25 DIAGNOSIS — H5710 Ocular pain, unspecified eye: Secondary | ICD-10-CM | POA: Insufficient documentation

## 2013-09-25 DIAGNOSIS — J45909 Unspecified asthma, uncomplicated: Secondary | ICD-10-CM | POA: Insufficient documentation

## 2013-09-25 DIAGNOSIS — J449 Chronic obstructive pulmonary disease, unspecified: Secondary | ICD-10-CM | POA: Insufficient documentation

## 2013-09-25 DIAGNOSIS — K047 Periapical abscess without sinus: Secondary | ICD-10-CM | POA: Insufficient documentation

## 2013-09-25 DIAGNOSIS — K029 Dental caries, unspecified: Secondary | ICD-10-CM | POA: Insufficient documentation

## 2013-09-25 DIAGNOSIS — I10 Essential (primary) hypertension: Secondary | ICD-10-CM | POA: Insufficient documentation

## 2013-09-25 DIAGNOSIS — J4489 Other specified chronic obstructive pulmonary disease: Secondary | ICD-10-CM | POA: Insufficient documentation

## 2013-09-25 MED ORDER — CLINDAMYCIN HCL 150 MG PO CAPS
300.0000 mg | ORAL_CAPSULE | Freq: Once | ORAL | Status: AC
  Administered 2013-09-25: 300 mg via ORAL
  Filled 2013-09-25: qty 2

## 2013-09-25 MED ORDER — CLINDAMYCIN HCL 150 MG PO CAPS: 300.0000 mg | ORAL_CAPSULE | Freq: Four times a day (QID) | ORAL | Status: DC

## 2013-09-25 MED ORDER — IBUPROFEN 600 MG PO TABS: 600.0000 mg | ORAL_TABLET | Freq: Three times a day (TID) | ORAL | Status: DC | PRN

## 2013-09-25 NOTE — ED Provider Notes (Signed)
CSN: [id number]     Arrival date & time 09/25/13  1341 History  This chart was scribed for Isaias Sakai, NP working with Gavin Pound. Oletta Lamas, MD by Carl Best, ED Scribe. This patient was seen in room TR08C/TR08C and the patient's care was started at 3:06 PM.    Chief Complaint  Patient presents with  . Facial Swelling    Patient is a 42 y.o. female presenting with tooth pain. The history is provided by the patient. No language interpreter was used.  Dental Pain Location:  Upper Upper teeth location:  15/LU 2nd molar and 16/LU 3rd molar Quality:  Aching Severity:  Moderate Onset quality:  Gradual Duration:  1 week Timing:  Constant Progression:  Worsening Chronicity:  New Context: dental caries   Prior workup: none. Relieved by:  None tried Worsened by:  Pressure Ineffective treatments:  None tried Associated symptoms: facial swelling (left cheek)   Associated symptoms: no fever and no neck pain   Risk factors: lack of dental care, periodontal disease and smoking    HPI Comments:   Past Medical History  Diagnosis Date  . COPD (chronic obstructive pulmonary disease)   . Hidradenitis suppurativa   . Asthma     PFT 6/11 FEV1 76%, 13% chagne sp BD.   Marland Kitchen Benign essential hypertension   . Morbid obesity   . Chest pain   . Obstructive sleep apnea   . Arthritis   . Osteoporosis    History reviewed. No pertinent past surgical history. Family History  Problem Relation Age of Onset  . Alcohol abuse Sister   . Asthma Other   . Diabetes Mother   . Hypertension Mother   . Diabetes Father   . Hypertension Father   . Heart disease Father   . Cancer Paternal Aunt     skin  . Cancer Paternal Uncle     skin  . Cancer Maternal Grandmother     pancreatic  . Stroke Paternal Grandmother    History  Substance Use Topics  . Smoking status: Current Some Day Smoker -- 1.00 packs/day for 20 years    Types: Cigarettes    Last Attempt to Quit: 12/11/2009  . Smokeless tobacco:  Never Used     Comment: restarted  . Alcohol Use: Yes     Comment: occasionally   OB History   Grav Para Term Preterm Abortions TAB SAB Ect Mult Living                 Review of Systems  Constitutional: Negative for fever and chills.  HENT: Positive for dental problem and facial swelling (left cheek).   Eyes: Positive for pain (left eye) and discharge (left eye).  Respiratory: Negative for shortness of breath.   Cardiovascular: Negative for chest pain.  Gastrointestinal: Positive for nausea.  Genitourinary: Negative for urgency and difficulty urinating.  Musculoskeletal: Negative for arthralgias, back pain and neck pain.  Skin: Negative for rash and wound.  Neurological: Negative for facial asymmetry and numbness.  Hematological: Negative for adenopathy.  Psychiatric/Behavioral: Negative for confusion. The patient is not nervous/anxious.     Allergies  Aspirin  Home Medications   Current Outpatient Rx  Name  Route  Sig  Dispense  Refill  . albuterol (PROVENTIL HFA;VENTOLIN HFA) 108 (90 BASE) MCG/ACT inhaler   Inhalation   Inhale 2 puffs into the lungs every 4 (four) hours as needed. FOR SHORTNESS OF BREATH   4 Inhaler   3   . albuterol (PROVENTIL) (2.5  MG/3ML) 0.083% nebulizer solution   Nebulization   Take 3 mLs (2.5 mg total) by nebulization every 6 (six) hours as needed. FOR SHORTNESS OF BREATH   75 mL   4   . mometasone-formoterol (DULERA) 200-5 MCG/ACT AERO   Inhalation   Inhale 2 puffs into the lungs 2 (two) times daily.   1 Inhaler   0   . olmesartan-hydrochlorothiazide (BENICAR HCT) 20-12.5 MG per tablet   Oral   Take 1 tablet by mouth daily.   7 tablet   0   . oxyCODONE (ROXICODONE) 5 MG immediate release tablet   Oral   Take 1 tablet (5 mg total) by mouth every 6 (six) hours as needed for pain.   120 tablet   0   . Tetrahydrozoline HCl (VISINE OP)   Both Eyes   Place 2 drops into both eyes daily as needed.          Triage Vitals: BP  143/85  Pulse 71  Temp(Src) 98.6 F (37 C) (Oral)  Resp 18  Wt 285 lb 4.8 oz (129.411 kg)  BMI 46.07 kg/m2  SpO2 96%  LMP 09/09/2013  Physical Exam  Nursing note and vitals reviewed. Constitutional: She is oriented to person, place, and time. She appears well-developed and well-nourished.  HENT:  Head: Normocephalic and atraumatic.  Right Ear: External ear normal.  Left Ear: External ear normal.  Mouth/Throat: Oropharynx is clear and moist and mucous membranes are normal. No trismus in the jaw. No uvula swelling.    Eyes: Conjunctivae and EOM are normal. Pupils are equal, round, and reactive to light.  Neck: Normal range of motion. Neck supple.  Musculoskeletal: Normal range of motion.  Lymphadenopathy:    She has no cervical adenopathy.  Neurological: She is alert and oriented to person, place, and time.  Skin: Skin is warm and dry.  Psychiatric: She has a normal mood and affect.    ED Course  Procedures (including critical care time)  DIAGNOSTIC STUDIES: Oxygen Saturation is 96% on room air, adequate by my interpretation.    COORDINATION OF CARE: 3:10 PM- Patient with dental pain to percussion and mild facial swelling. No evidence of facial cellulitis, preseptal cellulitis nor orbital cellulitis. No fluid collection to drain. Afebrile and nontoxic. Felt stable for d/c. Will treat probable underlying dental abscess with clindamycin and recommend f/u with dentist in [place] as scheduled. Strict return instructions discussed and provided to the patient in writing at time of d/c.    Labs Review Labs Reviewed - No data to display Imaging Review No results found.  EKG Interpretation   None       MDM   1. Dental abscess     I personally performed the services described in this documentation, which was scribed in my presence. The recorded information has been reviewed and is accurate.    Simmie Davies, NP 10/13/13 [phone]

## 2013-09-25 NOTE — ED Notes (Signed)
Pt started 1 week ago with gum infection(?). Pain and swelling started moving up left face and around left eye. Periorbital redness noted.

## 2013-09-25 NOTE — ED Notes (Signed)
Pt states that she has been feeling like she has some facial swelling x 1 week.  Pt reports that she had swelling on upper jaw also with pain in gum and some swelling to her left eye.  No sob or swelling in her mouth.  Pt states that she had some "sticky drainage" from her left eye, it is mostly the area below the left eye and the lower lid that are swollen

## 2013-10-14 NOTE — ED Provider Notes (Signed)
Medical screening examination/treatment/procedure(s) were performed by non-physician practitioner and as supervising physician I was immediately available for consultation/collaboration.  EKG Interpretation   None         Noeli Lavery Y. Shuntavia Yerby, MD 10/14/13 1124 

## 2013-10-16 ENCOUNTER — Other Ambulatory Visit: Payer: Self-pay

## 2013-10-23 ENCOUNTER — Emergency Department (HOSPITAL_COMMUNITY): Payer: Medicaid Other

## 2013-10-23 ENCOUNTER — Emergency Department (HOSPITAL_COMMUNITY)
Admission: EM | Admit: 2013-10-23 | Discharge: 2013-10-23 | Disposition: A | Discharge status: Home / Self Care | Payer: Medicaid Other | Attending: Emergency Medicine | Admitting: Emergency Medicine

## 2013-10-23 ENCOUNTER — Encounter (HOSPITAL_COMMUNITY): Payer: Self-pay | Admitting: Emergency Medicine

## 2013-10-23 DIAGNOSIS — J441 Chronic obstructive pulmonary disease with (acute) exacerbation: Secondary | ICD-10-CM

## 2013-10-23 DIAGNOSIS — IMO0002 Reserved for concepts with insufficient information to code with codable children: Secondary | ICD-10-CM | POA: Insufficient documentation

## 2013-10-23 DIAGNOSIS — Z8669 Personal history of other diseases of the nervous system and sense organs: Secondary | ICD-10-CM | POA: Insufficient documentation

## 2013-10-23 DIAGNOSIS — Z792 Long term (current) use of antibiotics: Secondary | ICD-10-CM | POA: Insufficient documentation

## 2013-10-23 DIAGNOSIS — I10 Essential (primary) hypertension: Secondary | ICD-10-CM | POA: Insufficient documentation

## 2013-10-23 DIAGNOSIS — M129 Arthropathy, unspecified: Secondary | ICD-10-CM | POA: Insufficient documentation

## 2013-10-23 DIAGNOSIS — Z872 Personal history of diseases of the skin and subcutaneous tissue: Secondary | ICD-10-CM | POA: Insufficient documentation

## 2013-10-23 DIAGNOSIS — Z87891 Personal history of nicotine dependence: Secondary | ICD-10-CM | POA: Insufficient documentation

## 2013-10-23 DIAGNOSIS — Z79899 Other long term (current) drug therapy: Secondary | ICD-10-CM | POA: Insufficient documentation

## 2013-10-23 DIAGNOSIS — J029 Acute pharyngitis, unspecified: Secondary | ICD-10-CM | POA: Insufficient documentation

## 2013-10-23 MED ORDER — DOXYCYCLINE HYCLATE 100 MG PO CAPS: 100.0000 mg | ORAL_CAPSULE | Freq: Two times a day (BID) | ORAL | Status: DC

## 2013-10-23 MED ORDER — PREDNISONE 20 MG PO TABS
60.0000 mg | ORAL_TABLET | Freq: Once | ORAL | Status: AC
  Administered 2013-10-23: 60 mg via ORAL
  Filled 2013-10-23: qty 3

## 2013-10-23 MED ORDER — PREDNISONE 20 MG PO TABS: 40.0000 mg | ORAL_TABLET | Freq: Every day | ORAL | Status: DC

## 2013-10-23 MED ORDER — ACETAMINOPHEN-CODEINE 120-12 MG/5ML PO SUSP: 5.0000 mL | Freq: Four times a day (QID) | ORAL | Status: DC | PRN

## 2013-10-23 MED ORDER — DEXAMETHASONE SODIUM PHOSPHATE 10 MG/ML IJ SOLN: 10.0000 mg | Freq: Once | INTRAMUSCULAR | Status: DC

## 2013-10-23 NOTE — ED Notes (Signed)
C/o prod cough x 2 weeks, worse with laying down. Denies fever, chills. SOB with coughing spells. CP with coughing, also worsens with deep breaths, tender to palpation, coughing. States husband also here in ED with same symptoms.

## 2013-10-23 NOTE — ED Provider Notes (Signed)
CSN: [id number]     Arrival date & time 10/23/13  0746 History   First MD Initiated Contact with Patient 10/23/13 0804     Chief Complaint  Patient presents with  . Chest Pain   (Consider location/radiation/quality/duration/timing/severity/associated sxs/prior Treatment) HPI Pt is a 42yo morbidly obese female with hx of COPD and asthma c/o left sided chest pain that started earlier this morning.  Pt reports having intermittent productive cough since 2010, however reports has significantly worsened over the last 2 weeks, associated with SOB and coughing spells.  Cough is worse when pt lies down.  Chest pain in intermittent, worse with deep inspiration, cough, and palpation.  Pt states if she pushes on her chest hard enough, the pain goes away.  She reports using albuterol nebulizer tx x3 PTA w/o relief.  Reports her father was recently admitted to hospital for pneumonia and her husband tx today for an asthma attack.  She is no longer a smoker but reports still being around other people who smoke which irritates her cough.  Reports 1 episode of vomiting yesterday not associated with coughing spell.  Denies fever, nausea or diarrhea.  Past Medical History  Diagnosis Date  . COPD (chronic obstructive pulmonary disease)   . Hidradenitis suppurativa   . Asthma     PFT 6/11 FEV1 76%, 13% chagne sp BD.   Marland Kitchen Benign essential hypertension   . Morbid obesity   . Chest pain   . Obstructive sleep apnea   . Arthritis   . Osteoporosis    History reviewed. No pertinent past surgical history. Family History  Problem Relation Age of Onset  . Alcohol abuse Sister   . Asthma Other   . Diabetes Mother   . Hypertension Mother   . Diabetes Father   . Hypertension Father   . Heart disease Father   . Cancer Paternal Aunt     skin  . Cancer Paternal Uncle     skin  . Cancer Maternal Grandmother     pancreatic  . Stroke Paternal Grandmother    History  Substance Use Topics  . Smoking status: Former  Smoker -- 1.00 packs/day for 20 years    Types: Cigarettes    Quit date: 12/11/2009  . Smokeless tobacco: Never Used     Comment: restarted  . Alcohol Use: No     Comment: occasionally   OB History   Grav Para Term Preterm Abortions TAB SAB Ect Mult Living                 Review of Systems  Constitutional: Negative for fever, chills and diaphoresis.  HENT: Positive for congestion, sore throat and voice change ( "from coughing too much"). Negative for drooling, rhinorrhea, tinnitus and trouble swallowing.   Respiratory: Positive for cough. Negative for shortness of breath.   Cardiovascular: Positive for chest pain. Negative for palpitations.  Gastrointestinal: Negative for nausea, vomiting and abdominal pain.  All other systems reviewed and are negative.    Allergies  Aspirin  Home Medications   Current Outpatient Rx  Name  Route  Sig  Dispense  Refill  . albuterol (PROVENTIL HFA;VENTOLIN HFA) 108 (90 BASE) MCG/ACT inhaler   Inhalation   Inhale 2 puffs into the lungs every 4 (four) hours as needed for shortness of breath. FOR SHORTNESS OF BREATH         . albuterol (PROVENTIL) (2.5 MG/3ML) 0.083% nebulizer solution   Nebulization   Take 2.5 mg by nebulization every 6 (six)  hours as needed for shortness of breath. FOR SHORTNESS OF BREATH         . ibuprofen (ADVIL,MOTRIN) 600 MG tablet   Oral   Take 600 mg by mouth every 4 (four) hours as needed for headache or moderate pain.         . mometasone-formoterol (DULERA) 200-5 MCG/ACT AERO   Inhalation   Inhale 2 puffs into the lungs 2 (two) times daily.   1 Inhaler   0   . acetaminophen-codeine 120-12 MG/5ML suspension   Oral   Take 5 mLs by mouth every 6 (six) hours as needed for pain.   60 mL   0   . clindamycin (CLEOCIN) 150 MG capsule   Oral   Take 2 capsules (300 mg total) by mouth 4 (four) times daily.   28 capsule   0   . doxycycline (VIBRAMYCIN) 100 MG capsule   Oral   Take 1 capsule (100 mg  total) by mouth 2 (two) times daily.   20 capsule   0   . predniSONE (DELTASONE) 20 MG tablet   Oral   Take 2 tablets (40 mg total) by mouth daily.   10 tablet   0    BP 133/65  Pulse 62  Temp(Src) 98.7 F (37.1 C) (Oral)  Resp 12  Ht 5\' 6"  (1.676 m)  Wt 288 lb 9.6 oz (130.908 kg)  BMI 46.60 kg/m2  SpO2 98%  LMP 10/23/2013 Physical Exam  Nursing note and vitals reviewed. Constitutional: She appears well-developed and well-nourished. No distress.  Morbidly obese female lying in exam bed, NAD.  HENT:  Head: Normocephalic and atraumatic.  Right Ear: Hearing, tympanic membrane, external ear and ear canal normal.  Left Ear: Hearing, tympanic membrane, external ear and ear canal normal.  Nose: Mucosal edema present. No rhinorrhea.  Mouth/Throat: Uvula is midline and mucous membranes are normal. Posterior oropharyngeal erythema present. No oropharyngeal exudate, posterior oropharyngeal edema or tonsillar abscesses.  Eyes: Conjunctivae are normal. No scleral icterus.  Neck: Normal range of motion.  Cardiovascular: Normal rate, regular rhythm and normal heart sounds.   Pulmonary/Chest: Effort normal and breath sounds normal. No respiratory distress. She has no wheezes. She has no rales. She exhibits no tenderness.  Intermittent productive cough. No respiratory distress, able to speak in full sentences w/o difficulty.  Lungs: CTAB.  Chest tenderness: pinpoint tenderness just left of mid-sternum.  Abdominal: Soft. Bowel sounds are normal. She exhibits no distension and no mass. There is no tenderness. There is no rebound and no guarding.  Musculoskeletal: Normal range of motion.  Neurological: She is alert.  Skin: Skin is warm and dry. She is not diaphoretic.    ED Course  Procedures (including critical care time) Labs Review Labs Reviewed - No data to display Imaging Review Dg Chest 2 View  10/23/2013   CLINICAL DATA:  Cough, congestion.  EXAM: CHEST  2 VIEW  COMPARISON:   01/16/2013.  FINDINGS: The heart size and mediastinal contours are within normal limits. Both lungs are clear. The visualized skeletal structures are unremarkable.  IMPRESSION: No active cardiopulmonary disease.   Electronically Signed   By: Elige Ko   On: 10/23/2013 09:17    EKG Interpretation     Ventricular Rate:  74 PR Interval:  180 QRS Duration: 84 QT Interval:  386 QTC Calculation: 428 R Axis:   51 Text Interpretation:  Normal sinus rhythm Cannot rule out Anterior infarct , age undetermined Abnormal ECG Unchanged from prior.  MDM   1. COPD exacerbation    Pt presenting with intermittent productive cough, PERC negative, pt is not hypoxic. No requiring O2. Chest pain atypical for ACS however will perform cardiac workup. On exam: Vitals unremarkable, O2 99% on RA. Lungs: CTAB. CXR: unremarkable. EKG: nl   Will start pt on prednisone, doxycycline, and acetaminophe-codeine for help with cough especially at night. All labs/imaging/findings discussed with patient. All questions answered and concerns addressed. Will discharge pt home and have pt f/u with PCP Dr. Manson Passey in 3 days if not improving. Return precautions given. Pt verbalized understanding and agreement with tx plan. Vitals: unremarkable. Discharged in stable condition.    Discussed pt with attending during ED encounter and agrees with plan.    Junius Finner, PA-C 10/23/13 1103

## 2013-10-23 NOTE — ED Notes (Signed)
Pt states she has had a cough "off and on", but this am she awoke with sternal chest pain she describes as sharp and not associated with her cough.  Pt also c/o sob that was not improved with nebulizer.

## 2013-10-25 NOTE — ED Provider Notes (Signed)
Medical screening examination/treatment/procedure(s) were performed by non-physician practitioner and as supervising physician I was immediately available for consultation/collaboration.  EKG Interpretation     Ventricular Rate:  74 PR Interval:  180 QRS Duration: 84 QT Interval:  386 QTC Calculation: 428 R Axis:   51 Text Interpretation:  Normal sinus rhythm Cannot rule out Anterior infarct , age undetermined Abnormal ECG Unchanged from prior.               Shanna Cisco, MD 10/25/13 [phone]

## 2014-01-16 ENCOUNTER — Ambulatory Visit (INDEPENDENT_AMBULATORY_CARE_PROVIDER_SITE_OTHER): Payer: Medicaid Other | Admitting: Internal Medicine

## 2014-01-16 ENCOUNTER — Encounter: Payer: Self-pay | Admitting: Internal Medicine

## 2014-01-16 VITALS — BP 142/97 | HR 68 | Temp 98.2°F | Ht 65.0 in | Wt 294.4 lb

## 2014-01-16 DIAGNOSIS — I10 Essential (primary) hypertension: Secondary | ICD-10-CM

## 2014-01-16 DIAGNOSIS — R059 Cough, unspecified: Secondary | ICD-10-CM

## 2014-01-16 DIAGNOSIS — M1710 Unilateral primary osteoarthritis, unspecified knee: Secondary | ICD-10-CM

## 2014-01-16 DIAGNOSIS — IMO0002 Reserved for concepts with insufficient information to code with codable children: Secondary | ICD-10-CM

## 2014-01-16 DIAGNOSIS — Z0000 Encounter for general adult medical examination without abnormal findings: Secondary | ICD-10-CM

## 2014-01-16 DIAGNOSIS — R05 Cough: Secondary | ICD-10-CM | POA: Insufficient documentation

## 2014-01-16 DIAGNOSIS — Z720 Tobacco use: Secondary | ICD-10-CM

## 2014-01-16 DIAGNOSIS — J45909 Unspecified asthma, uncomplicated: Secondary | ICD-10-CM

## 2014-01-16 DIAGNOSIS — G4733 Obstructive sleep apnea (adult) (pediatric): Secondary | ICD-10-CM

## 2014-01-16 DIAGNOSIS — S025XXA Fracture of tooth (traumatic), initial encounter for closed fracture: Secondary | ICD-10-CM

## 2014-01-16 DIAGNOSIS — G4730 Sleep apnea, unspecified: Secondary | ICD-10-CM

## 2014-01-16 DIAGNOSIS — F17200 Nicotine dependence, unspecified, uncomplicated: Secondary | ICD-10-CM

## 2014-01-16 DIAGNOSIS — K0381 Cracked tooth: Secondary | ICD-10-CM

## 2014-01-16 DIAGNOSIS — M25569 Pain in unspecified knee: Secondary | ICD-10-CM

## 2014-01-16 MED ORDER — NICOTINE POLACRILEX 2 MG MT GUM: 2.0000 mg | CHEWING_GUM | OROMUCOSAL | Status: AC | PRN

## 2014-01-16 MED ORDER — MOMETASONE FURO-FORMOTEROL FUM 200-5 MCG/ACT IN AERO: 2.0000 | INHALATION_SPRAY | Freq: Two times a day (BID) | RESPIRATORY_TRACT | Status: AC

## 2014-01-16 MED ORDER — OXYCODONE-ACETAMINOPHEN 5-325 MG PO TABS: 1.0000 | ORAL_TABLET | Freq: Three times a day (TID) | ORAL | Status: AC | PRN

## 2014-01-16 MED ORDER — GUAIFENESIN ER 600 MG PO TB12: 600.0000 mg | ORAL_TABLET | Freq: Two times a day (BID) | ORAL | Status: AC

## 2014-01-16 MED ORDER — BENZONATATE 100 MG PO CAPS: 100.0000 mg | ORAL_CAPSULE | Freq: Four times a day (QID) | ORAL | Status: AC | PRN

## 2014-01-16 NOTE — Assessment & Plan Note (Signed)
Advised patient about importance of quitting smoking again, patient seems contemplative.  Provided smoking cessation counseling and sent in rx for nicotine gum.

## 2014-01-16 NOTE — Assessment & Plan Note (Addendum)
BP 142/97 on repeat today.  No antihypertensive therapy initiated (patient has taken HCTZ, ACEi, B blocker at different times in past).  Provided patient a BP log that she will fill out and bring into her next appointment.

## 2014-01-16 NOTE — Assessment & Plan Note (Signed)
Patient presented today with cough.  Cough is nonproductive, no increased shortness of breath, no fever.  On exam, lungs CTAB, no wheezing, crackles or rhonchi.  Patient recently started smoking again which is likely contributing to her cough.  On chart review, patient had negative CXR, negative CTA, and negative troponins during ED visit at [facility] on 12/26/13.  Explained to patient that it did not appear that she had a pneumonia from that imaging and that I could not find documentation about Z pack for reported pneumonia.  Provided reassurance that she is very unlikely to have PNA at this time.   -Instructed her that she needs to quit smoking and resume using CPAP.   -Prescribed Mucinex and Tessalon for cough/congestion.   -Consider follow-up CXR at next visit (in March) given reported history of left sided PNA.

## 2014-01-16 NOTE — Assessment & Plan Note (Signed)
Refilled Dulera inhaler today.  We discussed that albuterol should be used as rescue inhaler only.   Note: lisinopril stopped last year as possible cause of cough, patient still has chronic cough.

## 2014-01-16 NOTE — Progress Notes (Signed)
Patient ID: Joan Brown, female   DOB: 1971-01-15, 43 y.o.   MRN: [id number]   Subjective:   Patient ID: Joan Brown female   DOB: 1971/04/09 43 y.o.   MRN: [id number]  HPI: [name] is a 43 y.o. woman with history of HTN, asthma, OSA on CPAP, osteitis pubis, chronic left knee pain who presents for cough.   Patient states she was seen in [facility] ED in [facility] ago for chest pain, had negative CXR while there and was discharged home.  She states that the next day, she got a phone call from a doctor who told her that on further review, she appeared to have a pneumonia in her left lung and prescribed her a Z pack.  She took the antibiotics as prescribed.  She has baseline shortness of breath, not increased today.  Cough is nonproductive, denies fever.  She feels that her wheezing is worse than usual.  She has history of asthma, states that she ran out of her [facility] inhaler a while ago and has been using Albuterol frequently, 3x already today.  She did recently start back smoking (about 1 PPD) since her father passed away in past couple of weeks.  She has also not been using her CPAP in that time.  She says she was seen in [facility] ED in 10/2013 and told she had a COPD exacerbation(?- CXR at that time showed clear lungs without hyperexpansion) treated with steroids and antibiotics.     Patient states she has been donating plasma recently, almost every week, and that her blood pressure is always normal when they check it.  She used to take HCTZ but says one of her doctors discontinued it "because it wasn't working."  Patient also complaining of her chronic knee pain.  She states that she never got a phone call for sports medicine appointment time, is asking for pain medication today.  No pain contract.   Patient also would like to see a dentist for a tooth she cracked while eating an apple.  She was referred to a dentist in past but does not remember where.     Past Medical  History  Diagnosis Date  . COPD (chronic obstructive pulmonary disease)   . Hidradenitis suppurativa   . Asthma     PFT 6/11 FEV1 76%, 13% chagne sp BD.   Marland Kitchen Benign essential hypertension   . Morbid obesity   . Chest pain   . Obstructive sleep apnea   . Arthritis   . Osteoporosis    Current Outpatient Prescriptions  Medication Sig Dispense Refill  . albuterol (PROVENTIL HFA;VENTOLIN HFA) 108 (90 BASE) MCG/ACT inhaler Inhale 2 puffs into the lungs every 4 (four) hours as needed for shortness of breath. FOR SHORTNESS OF BREATH      . albuterol (PROVENTIL) (2.5 MG/3ML) 0.083% nebulizer solution Take 2.5 mg by nebulization every 6 (six) hours as needed for shortness of breath. FOR SHORTNESS OF BREATH      . benzonatate (TESSALON PERLES) 100 MG capsule Take 1 capsule (100 mg total) by mouth every 6 (six) hours as needed for cough.  30 capsule  0  . guaiFENesin (MUCINEX) 600 MG 12 hr tablet Take 1 tablet (600 mg total) by mouth 2 (two) times daily.  30 tablet  15  . mometasone-formoterol (DULERA) 200-5 MCG/ACT AERO Inhale 2 puffs into the lungs 2 (two) times daily.  1 Inhaler  0  . nicotine polacrilex (EQ NICOTINE) 2 MG gum Take 1 each (  2 mg total) by mouth as needed for smoking cessation.  100 tablet  0  . oxyCODONE-acetaminophen (ROXICET) 5-325 MG per tablet Take 1 tablet by mouth every 8 (eight) hours as needed for severe pain.  42 tablet  0   No current facility-administered medications for this visit.   Family History  Problem Relation Age of Onset  . Alcohol abuse Sister   . Asthma Other   . Diabetes Mother   . Hypertension Mother   . Diabetes Father   . Hypertension Father   . Heart disease Father   . Cancer Paternal Aunt     skin  . Cancer Paternal Uncle     skin  . Cancer Maternal Grandmother     pancreatic  . Stroke Paternal Grandmother    History   Social History  . Marital Status: Single    Spouse Name: N/A    Number of Children: 1  . Years of Education: N/A    Occupational History  . hair stylist    Social History Main Topics  . Smoking status: Former Smoker -- 1.00 packs/day for 20 years    Types: Cigarettes    Quit date: 12/11/2009  . Smokeless tobacco: Never Used     Comment: restarted  . Alcohol Use: No     Comment: occasionally  . Drug Use: No  . Sexual Activity: None   Other Topics Concern  . None   Social History Narrative   Financial assistance approved for 100% discount at [facility] and has [facility] card per Bonna Gains   11/11/2010       patient is single with no children she is a hairstylist         Review of Systems: Review of Systems  Constitutional: Negative for fever, chills and weight loss.  HENT: Positive for congestion.   Eyes: Negative for blurred vision.  Respiratory: Positive for cough, shortness of breath and wheezing. Negative for sputum production.   Cardiovascular: Negative for chest pain, palpitations and leg swelling.  Gastrointestinal: Negative for nausea, vomiting, abdominal pain, diarrhea, constipation and blood in stool.  Genitourinary: Negative for dysuria.  Musculoskeletal: Positive for back pain and joint pain. Negative for falls.  Neurological: Negative for dizziness, loss of consciousness, weakness and headaches.    Objective:  Physical Exam: Filed Vitals:   01/16/14 1038 01/16/14 1141  BP: 165/100 142/97  Pulse: 68   Temp: 98.2 F (36.8 C)   TempSrc: Oral   Height: 5\' 5"  (1.651 m)   Weight: 294 lb 6.4 oz (133.539 kg)   SpO2: 97%    General: alert, cooperative, and in no apparent distress HEENT: NCAT, vision grossly intact, oropharynx clear and non-erythematous  Neck: supple, no lymphadenopathy Lungs: clear to ascultation bilaterally, normal work of respiration, no wheezes, rales, ronchi Heart: regular rate and rhythm, no murmurs, gallops, or rubs Abdomen: soft, non-tender, non-distended, normal bowel sounds Extremities: 2+ DP/PT pulses bilaterally, no cyanosis, clubbing, or  edema Neurologic: alert & oriented X3, cranial nerves II-XII intact, strength grossly intact, sensation intact to light touch  Assessment & Plan:  Patient discussed with Dr. Murlean Caller.  Please see problem-based assessment and plan.

## 2014-01-16 NOTE — Assessment & Plan Note (Signed)
Flu shot at next visit if not too late in season.

## 2014-01-16 NOTE — Assessment & Plan Note (Signed)
Instructed patient to resume using her CPAP at night.  Need to encourage weight loss at next visit.

## 2014-01-16 NOTE — Assessment & Plan Note (Signed)
Gave patient list of dentists in area and went through them with her.  She will follow-up with the one on [address] in [place].

## 2014-01-16 NOTE — Patient Instructions (Addendum)
Follow-up with Dr. Owens Shark in 1 month.   We reviewed all of your imaging from [facility], and there is no evidence of pneumonia that we can find.   Please take your medicines as prescribed.  We refilled your [facility] inhaler today.  We also sent in prescriptions for 2 medicines, one for congestion and one for cough.  You may take each of these up to twice daily.  It is very important that you use your CPAP and quit smoking.  We sent in a prescription for nicotine gum to help with this, you can also call 1800QUITNOW for further support.  It is also important that you use for CPAP machine while sleeping.   Please check your blood pressure as often as possible (ideally every day) and record the values in the BP log we gave you today.  You should bring this back to your next visit so we can give you a blood pressure medicine if necessary.   I am very sorry for your recent loss.    Emergency Department Resource Guide 1) Find a Doctor and Pay Out of Pocket Although you won't have to find out who is covered by your insurance plan, it is a good idea to ask around and get recommendations. You will then need to call the office and see if the doctor you have chosen will accept you as a new patient and what types of options they offer for patients who are self-pay. Some doctors offer discounts or will set up payment plans for their patients who do not have insurance, but you will need to ask so you aren't surprised when you get to your appointment.  2) Contact Your Local Health Department Not all health departments have doctors that can see patients for sick visits, but many do, so it is worth a call to see if yours does. If you don't know where your local health department is, you can check in your phone book. The CDC also has a tool to help you locate your state's health department, and many state websites also have listings of all of their local health departments.  3) Find a [facility] If your  illness is not likely to be very severe or complicated, you may want to try a walk in clinic. These are popping up all over the country in pharmacies, drugstores, and shopping centers. They're usually staffed by nurse practitioners or physician assistants that have been trained to treat common illnesses and complaints. They're usually fairly quick and inexpensive. However, if you have serious medical issues or chronic medical problems, these are probably not your best option.  No Primary Care Doctor: - Call Health Connect at  [phone] - they can help you locate a primary care doctor that  accepts your insurance, provides certain services, etc. - Physician Referral Service- [phone]  Chronic Pain Problems: Organization         Address  Phone   Notes  [facility]  [phone] Patients need to be referred by their primary care doctor.   Medication Assistance: Organization         Address  Phone   Notes  [facility] [address] [place], [place] [address] [phone] --Must be a resident of [facility] [place] -- Must have NO insurance coverage whatsoever (no Medicaid/ Medicare, etc.) -- The pt. MUST have a primary care doctor that directs their care regularly and follows them in the community   MedAssist  (  [phone]   [employer]  [phone]    Agencies that provide inexpensive medical care: Organization         Address  Phone   Notes  [place]  [phone]   [facility]    [phone]   [facility] [place], [place] [address] [phone]   [place] [address] [place] [phone]   Planned Parenthood    [phone]   [facility]    [phone]   [place] and [place] [address], [place] Phone:  [phone], Fax:  [phone] Hours of Operation:   9 am - 6 pm, M-F.  Also accepts Medicaid/Medicare and self-pay.  [facility] [place], [address], [place] Phone: [phone], Fax: [phone]. Hours of Operation:  8:30 am - 5:30 pm, M-F.  Also accepts Medicaid and self-pay.  [facility] [address], [place] Phone: [phone]   [place], [place], [place] [phone], Ext. [phone] Mondays & Thursdays: 7-9 AM.  First 15 patients are seen on a first come, first serve basis.    [place] Providers:  Organization         Address  Phone   Notes  [facility] [address], [place] [phone] Also accepts self-pay patients.  [facility] [id number] [place], [place]  [phone]   [place], [address], [place] [phone]   [facility] [address] [place] [phone]   Lucianne Lei [address], [place]   [phone] Only accepts [place] Access [place] patients after they have their name applied to their card.   Self-Pay (no insurance) in [facility]:  Organization         Address  Phone   Notes  Sickle Cell Patients, [facility] [place] [phone]   [facility] [place] [phone]   [facility] [place]  [place], [place], [place] [phone]   [facility]/Dr. Osei-Bonsu  [address] [place] or [address], [place] [phone] Phone number for both [place] and [place] locations is the same.  [facility] [address] [place] [phone]   [facility] [address], [place] or [address] [phone]   [facility] [address] [place] [phone], phone; [phone], fax Sees patients 1st and 3rd Saturday of every month.  Must not qualify for public or private insurance (i.e. Medicaid, Medicare,  Health Choice, Veterans' Benefits) . Household income should be no more than 200% of the poverty level .The clinic cannot treat you if you are pregnant or think you are pregnant . Sexually transmitted diseases are not treated at the clinic.    Dental Care: Organization         Address  Phone  Notes  [facility] of [facility] [place] [phone] Accepts children up to age 51 who are enrolled in [place] or [place]; pregnant women  with a Medicaid card; and children who have applied for Medicaid or [place] Health Choice, but were declined, whose parents can pay a reduced fee at time of service.  [facility] Department of [facility]  [address], [place] [phone] Accepts children up to age 73 who are enrolled in [place] or [place]; pregnant women with a Medicaid card; and children who have applied for Medicaid or [place] Health Choice, but were declined, whose parents can pay a reduced fee at time of service.  [facility] Access PROGRAM  [place] [phone] Patients are seen by appointment only. Walk-ins are not accepted. [place] will see patients 64 years of age and older. Monday - Tuesday (8am-5pm) Most Wednesdays (8:30-5pm) $30 per visit, cash only  [facility] Adult Dental Access PROGRAM  [address], [facility] [phone] Patients are seen by appointment only. Walk-ins are not accepted. [place] will see patients 44 years of age and older. One Wednesday Evening (Monthly: Volunteer Based).  $30 per visit, cash only  [place]  [phone] for adults; Children under age 28, call Graduate Pediatric Dentistry at  [phone]. Children aged 14-14, please call [phone] to request a pediatric application.  Dental services are provided in all areas of dental care including fillings, crowns and bridges, complete and partial dentures, implants, gum treatment, root canals, and extractions. Preventive care is also provided. Treatment is provided to both adults and children. Patients are selected via a lottery and there is often a waiting list.   [facility] [address], [place]  [phone] [email or web address]   [facility] [address] [place], [place] [phone], Ext. [phone] Second and Fourth Thursday of each month, opens at 6:30 AM; Clinic ends at 9 AM.  Patients are seen on a first-come first-served basis, and a limited number are seen during each clinic.   [facility]  [address] [place] D[place]er [place], [place] [phone]   Eligibility Requirements You must have lived in [place], [place], or [place] counties for at least the last three months.   You cannot be eligible for state or federal sponsored [employer], including [employer], [place], or [employer].   You generally cannot be eligible for healthcare insurance through your employer.    How to apply: Eligibility screenings are held every Tuesday and Wednesday afternoon from 1:00 pm until 4:00 pm. You do not need an appointment for the interview!  [facility] [address] [place], [place]   [place]  [place] Department  [place]  [phone]    Behavioral Health Resources in the Community: Intensive Outpatient Programs Organization         Address  Phone  Notes  [facility] [place]. [address] [place], [place] [phone]   [facility] [address], [place], [place]   ADS: Alcohol &  Drug Svcs [address] [place], [place]   [place] [address],  [place], [place] or [phone]   Substance Abuse Resources Organization         Address  Phone  Notes  Alcohol and Drug Services  [phone]   [facility]  [phone]   [facility] [place]   [facility]  [phone]   Residential & Outpatient Substance Abuse Program  [phone]   Psychological Services Organization         Address  Phone  Notes  [facility] [place]  [place]  [phone]   [place] [address] [place] or [phone]    Mobile Crisis Teams Organization         Address  Phone  Notes  [facility], Mobile Crisis Care Unit  [phone]   [facility]  [address] [place], [place]   [place] [address] [place] [phone]    Self-Help/Support Groups Organization         Address  Phone             Notes  [place]. of [place] - variety of support groups  [place]ioneer Call for more information  Narcotics Anonymous (NA), [facility] [address], [employer] [place]  2 meetings at this location   Grief Reaction Grief is a normal response to the death of someone close to you. Feelings of fear, anger, and guilt can affect almost everyone who loses someone they love. Symptoms of depression are also common. These include problems with sleep, loss of appetite, and lack of energy. These grief reaction symptoms often last for weeks to months after a loss. They may also return during special times that remind you of the person you lost, such as an anniversary or birthday. Anxiety, insomnia, irritability, and deep depression may last beyond the period of normal grief. If you experience these feelings for 6 months or longer, you may have clinical depression.  Clinical depression requires further medical attention. If you think that you have clinical depression, you should contact your caregiver. If you have a history of depression and or a family history of depression, you are at greater risk of clinical depression. You are also at greater risk of developing clinical depression if the loss was traumatic or the loss was of someone with whom you had unresolved issues.  A grief reaction can become complicated by being blocked. This means being unable to cry or express extreme emotions. This may prolong the grieving period and worsen the emotional effects of the loss. Mourning is a natural event in human life. A healthy grief reaction is one that is not blocked . It requires a time of sadness and readjustment.It is very important to share your sorrow and fear with others, especially close friends and family. Professional counselors and clergy can also help you process your grief. Document Released: 11/27/2005 Document Revised: 02/19/2012 Document Reviewed: 08/07/2006 [facility] Patient Information 2014 [place], [place].  Smoking Cessation Quitting smoking is important to your health and has many advantages. However, it is not always easy to quit since nicotine is a very addictive drug. Often times, people try 3 times or more before being able to quit. This document explains the best ways for you to prepare to quit smoking. Quitting takes hard work and a lot of effort, but you can do it. ADVANTAGES OF QUITTING SMOKING  You will live longer, feel better, and live better.  Your body will feel the impact of quitting smoking almost immediately.  Within 20 minutes, blood pressure decreases. Your pulse returns to its normal level.  After 8 hours, carbon monoxide levels in the blood return to normal. Your oxygen level increases.  After 24 hours, the chance of having a heart attack starts to  decrease. Your breath, hair, and body stop smelling like smoke.  After 48  hours, damaged nerve endings begin to recover. Your sense of taste and smell improve.  After 72 hours, the body is virtually free of nicotine. Your bronchial tubes relax and breathing becomes easier.  After 2 to 12 weeks, lungs can hold more air. Exercise becomes easier and circulation improves.  The risk of having a heart attack, stroke, cancer, or lung disease is greatly reduced.  After 1 year, the risk of coronary heart disease is cut in half.  After 5 years, the risk of stroke falls to the same as a nonsmoker.  After 10 years, the risk of lung cancer is cut in half and the risk of other cancers decreases significantly.  After 15 years, the risk of coronary heart disease drops, usually to the level of a nonsmoker.  If you are pregnant, quitting smoking will improve your chances of having a healthy baby.  The people you live with, especially any children, will be healthier.  You will have extra money to spend on things other than cigarettes. QUESTIONS TO THINK ABOUT BEFORE ATTEMPTING TO QUIT You may want to talk about your answers with your caregiver.  Why do you want to quit?  If you tried to quit in the past, what helped and what did not?  What will be the most difficult situations for you after you quit? How will you plan to handle them?  Who can help you through the tough times? Your family? Friends? A caregiver?  What pleasures do you get from smoking? What ways can you still get pleasure if you quit? Here are some questions to ask your caregiver:  How can you help me to be successful at quitting?  What medicine do you think would be best for me and how should I take it?  What should I do if I need more help?  What is smoking withdrawal like? How can I get information on withdrawal? GET READY  Set a quit date.  Change your environment by getting rid of all cigarettes, ashtrays, matches, and lighters in your home, car, or work. Do not let people smoke in your  home.  Review your past attempts to quit. Think about what worked and what did not. GET SUPPORT AND ENCOURAGEMENT You have a better chance of being successful if you have help. You can get support in many ways.  Tell your family, friends, and co-workers that you are going to quit and need their support. Ask them not to smoke around you.  Get individual, group, or telephone counseling and support. Programs are available at [employer] and health centers. Call your local health department for information about programs in your area.  Spiritual beliefs and practices may help some smokers quit.  Download a "quit meter" on your computer to keep track of quit statistics, such as how long you have gone without smoking, cigarettes not smoked, and money saved.  Get a self-help book about quitting smoking and staying off of tobacco. [place] yourself from urges to smoke. Talk to someone, go for a walk, or occupy your time with a task.  Change your normal routine. Take a different route to work. Drink tea instead of coffee. Eat breakfast in a different place.  Reduce your stress. Take a hot bath, exercise, or read a book.  Plan something enjoyable to do every day. Reward yourself for not smoking.  Explore interactive web-based programs  that specialize in helping you quit. GET MEDICINE AND USE IT CORRECTLY Medicines can help you stop smoking and decrease the urge to smoke. Combining medicine with the above behavioral methods and support can greatly increase your chances of successfully quitting smoking.  Nicotine replacement therapy helps deliver nicotine to your body without the negative effects and risks of smoking. Nicotine replacement therapy includes nicotine gum, lozenges, inhalers, nasal sprays, and skin patches. Some may be available over-the-counter and others require a prescription.  Antidepressant medicine helps people abstain from smoking, but how  this works is unknown. This medicine is available by prescription.  Nicotinic receptor partial agonist medicine simulates the effect of nicotine in your brain. This medicine is available by prescription. Ask your caregiver for advice about which medicines to use and how to use them based on your health history. Your caregiver will tell you what side effects to look out for if you choose to be on a medicine or therapy. Carefully read the information on the package. Do not use any other product containing nicotine while using a nicotine replacement product.  RELAPSE OR DIFFICULT SITUATIONS Most relapses occur within the first 3 months after quitting. Do not be discouraged if you start smoking again. Remember, most people try several times before finally quitting. You may have symptoms of withdrawal because your body is used to nicotine. You may crave cigarettes, be irritable, feel very hungry, cough often, get headaches, or have difficulty concentrating. The withdrawal symptoms are only temporary. They are strongest when you first quit, but they will go away within 10 14 days. To reduce the chances of relapse, try to:  Avoid drinking alcohol. Drinking lowers your chances of successfully quitting.  Reduce the amount of caffeine you consume. Once you quit smoking, the amount of caffeine in your body increases and can give you symptoms, such as a rapid heartbeat, sweating, and anxiety.  Avoid smokers because they can make you want to smoke.  Do not let weight gain distract you. Many smokers will gain weight when they quit, usually less than 10 pounds. Eat a healthy diet and stay active. You can always lose the weight gained after you quit.  Find ways to improve your mood other than smoking. FOR MORE INFORMATION  www.smokefree.gov  Document Released: 11/21/2001 Document Revised: 05/28/2012 Document Reviewed: 03/07/2012 [facility] Patient Information 2014 [place], [place].

## 2014-01-16 NOTE — Assessment & Plan Note (Addendum)
Patient complaining of chronic left knee pain today.  She has history of left knee pain due to OA, has seen sports med in past and received injections but last seen ~2 years ago.  Referred patient to sports medicine today as she would like to establish in [place] (where she now lives).  Nurse spent long time with patient identifying a sports medicine provider in [place] who accepts Medicaid.  I also gave her a two week supply of Percocet 5-325 mg q8h prn #42 and told her that absolutely no further narcotics would be prescribed until she followed up with sports medicine.

## 2014-01-19 ENCOUNTER — Ambulatory Visit (INDEPENDENT_AMBULATORY_CARE_PROVIDER_SITE_OTHER): Payer: Medicaid Other | Admitting: Sports Medicine

## 2014-01-19 ENCOUNTER — Encounter: Payer: Self-pay | Admitting: Sports Medicine

## 2014-01-19 ENCOUNTER — Ambulatory Visit (INDEPENDENT_AMBULATORY_CARE_PROVIDER_SITE_OTHER): Payer: Medicaid Other

## 2014-01-19 VITALS — BP 151/87 | HR 88 | Ht 66.0 in | Wt 293.0 lb

## 2014-01-19 DIAGNOSIS — M25569 Pain in unspecified knee: Secondary | ICD-10-CM

## 2014-01-19 MED ORDER — MELOXICAM 15 MG PO TABS: ORAL_TABLET | ORAL | Status: AC

## 2014-01-19 NOTE — Progress Notes (Signed)
   Subjective:    I'm seeing this patient as a consultation for:  Dr. Elnora Morrison with [facility]  CC: Left knee pain  HPI: This is a pleasant 43 year old female, she has had a long history of left knee pain that she localizes under the patella along the medial joint line, worse with weightbearing, worse when going up and down stairs, associated with grinding and clicking no other mechanical symptoms, she gets occasional buckling. There is only mild swelling. She has had injections in the past which have been effective. She was recently prescribed oxycodone, she is not yet taking NSAID. She is also not yet had physical therapy, bracing.  Past medical history, Surgical history, Family history not pertinant except as noted below, Social history, Allergies, and medications have been entered into the medical record, reviewed, and no changes needed.   Review of Systems: No headache, visual changes, nausea, vomiting, diarrhea, constipation, dizziness, abdominal pain, skin rash, fevers, chills, night sweats, weight loss, swollen lymph nodes, body aches, joint swelling, muscle aches, chest pain, shortness of breath, mood changes, visual or auditory hallucinations.   Objective:   General: Well Developed, well nourished, and in no acute distress.  Neuro/Psych: Alert and oriented x3, extra-ocular muscles intact, able to move all 4 extremities, sensation grossly intact. Skin: Warm and dry, no rashes noted.  Respiratory: Not using accessory muscles, speaking in full sentences, trachea midline.  Cardiovascular: Pulses palpable, no extremity edema. Abdomen: Does not appear distended. Left Knee: Normal to inspection with no erythema or effusion or obvious bony abnormalities. Tender palpation along the medial joint line. ROM full in flexion and extension and lower leg rotation. Ligaments with solid consistent endpoints including ACL, PCL, LCL, MCL. Negative Mcmurray's, Apley's, and  Thessalonian tests. Painful patellar compression with crepitus. Patellar and quadriceps tendons unremarkable. Hamstring and quadriceps strength is normal.   Procedure: Real-time Ultrasound Guided Injection of left knee Device: GE Logiq E  Verbal informed consent obtained.  Time-out conducted.  Noted no overlying erythema, induration, or other signs of local infection.  Skin prepped in a sterile fashion.  Local anesthesia: Topical Ethyl chloride.  With sterile technique and under real time ultrasound guidance:  2 cc Kenalog 40, 4 cc lidocaine injected easily into the suprapatellar recess. Completed without difficulty  Pain immediately resolved suggesting accurate placement of the medication.  Advised to call if fevers/chills, erythema, induration, drainage, or persistent bleeding.  Images permanently stored and available for review in the ultrasound unit.  Impression: Technically successful ultrasound guided injection.  Repeat x-rays did show mild to moderate tricompartmental osteoarthritis predominant on the patellofemoral joint.  Impression and Recommendations:   This case required medical decision making of moderate complexity.

## 2014-01-19 NOTE — Assessment & Plan Note (Addendum)
Left knee osteoarthritis on old x-rays. Still with pain despite Percocet. Injection as above. Mobic, formal physical therapy, new x-rays. Return to see me in one month. I can perform Viscosupplementation if no better.  I would avoid further narcotics in this patient, I am happy to continue to treat her interventionally.

## 2014-01-21 NOTE — Progress Notes (Signed)
Case discussed with Dr. Stann Mainland at time of visit.  We reviewed the resident's history and exam and pertinent patient test results.  I agree with the assessment, diagnosis, and plan of care documented in the resident's note.
